# Patient Record
Sex: Male | Born: 1980 | Race: White | Hispanic: No | Marital: Single | Smoking: Current every day smoker
Health system: Southern US, Community
[De-identification: ages and names within clinical notes are randomized; demographics above are authoritative.]

## PROBLEM LIST (undated history)

## (undated) DIAGNOSIS — I10 Essential (primary) hypertension: Secondary | ICD-10-CM

## (undated) DIAGNOSIS — F32A Depression, unspecified: Secondary | ICD-10-CM

## (undated) HISTORY — PX: SHOULDER ARTHROSCOPY: SHX128

---

## 2003-05-14 ENCOUNTER — Encounter: Payer: Self-pay | Admitting: *Deleted

## 2003-05-14 ENCOUNTER — Emergency Department (HOSPITAL_COMMUNITY): Admission: EM | Admit: 2003-05-14 | Discharge: 2003-05-14 | Payer: Self-pay | Admitting: *Deleted

## 2003-06-23 ENCOUNTER — Ambulatory Visit (HOSPITAL_COMMUNITY): Admission: RE | Admit: 2003-06-23 | Discharge: 2003-06-23 | Payer: Self-pay | Admitting: Orthopaedic Surgery

## 2003-06-23 ENCOUNTER — Encounter: Payer: Self-pay | Admitting: Orthopaedic Surgery

## 2003-09-16 ENCOUNTER — Emergency Department (HOSPITAL_COMMUNITY): Admission: EM | Admit: 2003-09-16 | Discharge: 2003-09-16 | Payer: Self-pay | Admitting: Emergency Medicine

## 2003-11-18 ENCOUNTER — Emergency Department (HOSPITAL_COMMUNITY): Admission: EM | Admit: 2003-11-18 | Discharge: 2003-11-18 | Payer: Self-pay | Admitting: Emergency Medicine

## 2003-12-19 ENCOUNTER — Emergency Department (HOSPITAL_COMMUNITY): Admission: EM | Admit: 2003-12-19 | Discharge: 2003-12-19 | Payer: Self-pay | Admitting: Emergency Medicine

## 2004-03-22 ENCOUNTER — Ambulatory Visit (HOSPITAL_COMMUNITY): Admission: RE | Admit: 2004-03-22 | Discharge: 2004-03-22 | Payer: Self-pay | Admitting: Orthopaedic Surgery

## 2004-12-30 ENCOUNTER — Emergency Department (HOSPITAL_COMMUNITY): Admission: EM | Admit: 2004-12-30 | Discharge: 2004-12-30 | Payer: Self-pay | Admitting: Emergency Medicine

## 2005-02-17 ENCOUNTER — Emergency Department (HOSPITAL_COMMUNITY): Admission: EM | Admit: 2005-02-17 | Discharge: 2005-02-17 | Payer: Self-pay | Admitting: Emergency Medicine

## 2005-12-22 ENCOUNTER — Emergency Department (HOSPITAL_COMMUNITY): Admission: EM | Admit: 2005-12-22 | Discharge: 2005-12-23 | Payer: Self-pay | Admitting: Emergency Medicine

## 2006-05-23 ENCOUNTER — Emergency Department (HOSPITAL_COMMUNITY): Admission: EM | Admit: 2006-05-23 | Discharge: 2006-05-23 | Payer: Self-pay | Admitting: Emergency Medicine

## 2006-06-06 ENCOUNTER — Emergency Department (HOSPITAL_COMMUNITY): Admission: EM | Admit: 2006-06-06 | Discharge: 2006-06-06 | Payer: Self-pay | Admitting: Emergency Medicine

## 2006-07-15 ENCOUNTER — Emergency Department (HOSPITAL_COMMUNITY): Admission: EM | Admit: 2006-07-15 | Discharge: 2006-07-15 | Payer: Self-pay | Admitting: Emergency Medicine

## 2007-06-07 ENCOUNTER — Emergency Department (HOSPITAL_COMMUNITY): Admission: EM | Admit: 2007-06-07 | Discharge: 2007-06-07 | Payer: Self-pay | Admitting: Emergency Medicine

## 2007-08-14 ENCOUNTER — Emergency Department (HOSPITAL_COMMUNITY): Admission: EM | Admit: 2007-08-14 | Discharge: 2007-08-14 | Payer: Self-pay | Admitting: Emergency Medicine

## 2007-09-17 ENCOUNTER — Emergency Department (HOSPITAL_COMMUNITY): Admission: EM | Admit: 2007-09-17 | Discharge: 2007-09-17 | Payer: Self-pay | Admitting: Emergency Medicine

## 2008-08-16 ENCOUNTER — Emergency Department (HOSPITAL_COMMUNITY): Admission: EM | Admit: 2008-08-16 | Discharge: 2008-08-16 | Payer: Self-pay | Admitting: Emergency Medicine

## 2008-08-31 ENCOUNTER — Emergency Department (HOSPITAL_COMMUNITY): Admission: EM | Admit: 2008-08-31 | Discharge: 2008-08-31 | Payer: Self-pay | Admitting: Emergency Medicine

## 2008-09-30 ENCOUNTER — Emergency Department (HOSPITAL_COMMUNITY): Admission: EM | Admit: 2008-09-30 | Discharge: 2008-09-30 | Payer: Self-pay | Admitting: Emergency Medicine

## 2008-10-07 ENCOUNTER — Emergency Department (HOSPITAL_COMMUNITY): Admission: EM | Admit: 2008-10-07 | Discharge: 2008-10-07 | Payer: Self-pay | Admitting: Emergency Medicine
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
## 2009-07-02 ENCOUNTER — Emergency Department (HOSPITAL_COMMUNITY): Admission: EM | Admit: 2009-07-02 | Discharge: 2009-07-02 | Payer: Self-pay | Admitting: Emergency Medicine

## 2010-12-31 ENCOUNTER — Emergency Department (HOSPITAL_COMMUNITY): Payer: Non-veteran care

## 2010-12-31 ENCOUNTER — Emergency Department (HOSPITAL_COMMUNITY)
Admission: EM | Admit: 2010-12-31 | Discharge: 2010-12-31 | Disposition: A | Discharge status: Home / Self Care | Payer: Non-veteran care | Attending: Emergency Medicine | Admitting: Emergency Medicine

## 2010-12-31 DIAGNOSIS — S4980XA Other specified injuries of shoulder and upper arm, unspecified arm, initial encounter: Secondary | ICD-10-CM | POA: Insufficient documentation

## 2010-12-31 DIAGNOSIS — S46909A Unspecified injury of unspecified muscle, fascia and tendon at shoulder and upper arm level, unspecified arm, initial encounter: Secondary | ICD-10-CM | POA: Insufficient documentation

## 2010-12-31 DIAGNOSIS — M25519 Pain in unspecified shoulder: Secondary | ICD-10-CM | POA: Insufficient documentation

## 2010-12-31 DIAGNOSIS — X500XXA Overexertion from strenuous movement or load, initial encounter: Secondary | ICD-10-CM | POA: Insufficient documentation

## 2011-01-06 ENCOUNTER — Emergency Department (HOSPITAL_COMMUNITY)
Admission: EM | Admit: 2011-01-06 | Discharge: 2011-01-06 | Disposition: A | Discharge status: Home / Self Care | Payer: Non-veteran care | Attending: Emergency Medicine | Admitting: Emergency Medicine

## 2011-01-06 DIAGNOSIS — F17200 Nicotine dependence, unspecified, uncomplicated: Secondary | ICD-10-CM | POA: Insufficient documentation

## 2011-01-06 DIAGNOSIS — M25519 Pain in unspecified shoulder: Secondary | ICD-10-CM | POA: Insufficient documentation

## 2011-01-13 ENCOUNTER — Emergency Department (HOSPITAL_COMMUNITY)
Admission: EM | Admit: 2011-01-13 | Discharge: 2011-01-13 | Disposition: A | Discharge status: Home / Self Care | Payer: Non-veteran care | Attending: Emergency Medicine | Admitting: Emergency Medicine

## 2011-01-13 ENCOUNTER — Emergency Department (HOSPITAL_COMMUNITY): Payer: Non-veteran care

## 2011-01-13 DIAGNOSIS — R51 Headache: Secondary | ICD-10-CM | POA: Insufficient documentation

## 2011-01-13 DIAGNOSIS — F17200 Nicotine dependence, unspecified, uncomplicated: Secondary | ICD-10-CM | POA: Insufficient documentation

## 2011-01-13 DIAGNOSIS — J111 Influenza due to unidentified influenza virus with other respiratory manifestations: Secondary | ICD-10-CM | POA: Insufficient documentation

## 2011-01-13 LAB — CBC
HCT: 42.2 % (ref 39.0–52.0)
Hemoglobin: 14.5 g/dL (ref 13.0–17.0)
MCH: 32.7 pg (ref 26.0–34.0)
MCHC: 34.4 g/dL (ref 30.0–36.0)
MCV: 95 fL (ref 78.0–100.0)
Platelets: 172 10*3/uL (ref 150–400)
RBC: 4.44 MIL/uL (ref 4.22–5.81)
RDW: 12.8 % (ref 11.5–15.5)
WBC: 6.6 10*3/uL (ref 4.0–10.5)

## 2011-01-13 LAB — DIFFERENTIAL
Basophils Absolute: 0 10*3/uL (ref 0.0–0.1)
Basophils Relative: 0 % (ref 0–1)
Eosinophils Absolute: 0.1 10*3/uL (ref 0.0–0.7)
Eosinophils Relative: 1 % (ref 0–5)
Lymphocytes Relative: 7 % — ABNORMAL LOW (ref 12–46)
Lymphs Abs: 0.5 10*3/uL — ABNORMAL LOW (ref 0.7–4.0)
Monocytes Absolute: 0.6 10*3/uL (ref 0.1–1.0)
Monocytes Relative: 9 % (ref 3–12)
Neutro Abs: 5.5 10*3/uL (ref 1.7–7.7)
Neutrophils Relative %: 84 % — ABNORMAL HIGH (ref 43–77)

## 2011-01-13 LAB — BASIC METABOLIC PANEL
BUN: 13 mg/dL (ref 6–23)
CO2: 25 mEq/L (ref 19–32)
Calcium: 9.6 mg/dL (ref 8.4–10.5)
Chloride: 108 mEq/L (ref 96–112)
Creatinine, Ser: 0.86 mg/dL (ref 0.4–1.5)
GFR calc Af Amer: >60 mL/min (ref >60)
GFR calc non Af Amer: >60 mL/min (ref >60)
Glucose, Bld: 115 mg/dL — ABNORMAL HIGH (ref 70–99)
Potassium: 4 mEq/L (ref 3.5–5.1)
Sodium: 140 mEq/L (ref 135–145)

## 2011-03-23 ENCOUNTER — Emergency Department (HOSPITAL_COMMUNITY): Payer: Non-veteran care

## 2011-03-23 ENCOUNTER — Emergency Department (HOSPITAL_COMMUNITY)
Admission: EM | Admit: 2011-03-23 | Discharge: 2011-03-23 | Disposition: A | Discharge status: Home / Self Care | Payer: Non-veteran care | Attending: Emergency Medicine | Admitting: Emergency Medicine

## 2011-03-23 DIAGNOSIS — IMO0002 Reserved for concepts with insufficient information to code with codable children: Secondary | ICD-10-CM | POA: Insufficient documentation

## 2011-03-23 DIAGNOSIS — F17200 Nicotine dependence, unspecified, uncomplicated: Secondary | ICD-10-CM | POA: Insufficient documentation

## 2011-03-23 DIAGNOSIS — X500XXA Overexertion from strenuous movement or load, initial encounter: Secondary | ICD-10-CM | POA: Insufficient documentation

## 2011-04-08 ENCOUNTER — Emergency Department (HOSPITAL_COMMUNITY): Payer: Non-veteran care

## 2011-04-08 ENCOUNTER — Emergency Department (HOSPITAL_COMMUNITY)
Admission: EM | Admit: 2011-04-08 | Discharge: 2011-04-08 | Disposition: A | Discharge status: Home / Self Care | Payer: Non-veteran care | Attending: Emergency Medicine | Admitting: Emergency Medicine

## 2011-04-08 DIAGNOSIS — M25519 Pain in unspecified shoulder: Secondary | ICD-10-CM | POA: Insufficient documentation

## 2011-04-20 ENCOUNTER — Emergency Department (HOSPITAL_COMMUNITY)
Admission: EM | Admit: 2011-04-20 | Discharge: 2011-04-20 | Disposition: A | Discharge status: Home / Self Care | Payer: Non-veteran care | Attending: Emergency Medicine | Admitting: Emergency Medicine

## 2011-04-20 DIAGNOSIS — F17200 Nicotine dependence, unspecified, uncomplicated: Secondary | ICD-10-CM | POA: Insufficient documentation

## 2011-04-20 DIAGNOSIS — M25519 Pain in unspecified shoulder: Secondary | ICD-10-CM | POA: Insufficient documentation

## 2011-05-02 ENCOUNTER — Emergency Department (HOSPITAL_COMMUNITY)
Admission: EM | Admit: 2011-05-02 | Discharge: 2011-05-02 | Disposition: A | Discharge status: Home / Self Care | Payer: Non-veteran care | Attending: Emergency Medicine | Admitting: Emergency Medicine

## 2011-05-02 DIAGNOSIS — M25519 Pain in unspecified shoulder: Secondary | ICD-10-CM | POA: Insufficient documentation

## 2011-05-02 DIAGNOSIS — M719 Bursopathy, unspecified: Secondary | ICD-10-CM | POA: Insufficient documentation

## 2011-05-02 DIAGNOSIS — M67919 Unspecified disorder of synovium and tendon, unspecified shoulder: Secondary | ICD-10-CM | POA: Insufficient documentation

## 2011-05-02 DIAGNOSIS — F17200 Nicotine dependence, unspecified, uncomplicated: Secondary | ICD-10-CM | POA: Insufficient documentation

## 2011-05-31 ENCOUNTER — Emergency Department (HOSPITAL_COMMUNITY)
Admission: EM | Admit: 2011-05-31 | Discharge: 2011-05-31 | Disposition: A | Discharge status: Home / Self Care | Payer: Non-veteran care | Attending: Emergency Medicine | Admitting: Emergency Medicine

## 2011-05-31 ENCOUNTER — Encounter: Payer: Self-pay | Admitting: *Deleted

## 2011-05-31 ENCOUNTER — Emergency Department (HOSPITAL_COMMUNITY): Payer: Non-veteran care

## 2011-05-31 DIAGNOSIS — S90129A Contusion of unspecified lesser toe(s) without damage to nail, initial encounter: Secondary | ICD-10-CM | POA: Insufficient documentation

## 2011-05-31 DIAGNOSIS — S4980XA Other specified injuries of shoulder and upper arm, unspecified arm, initial encounter: Secondary | ICD-10-CM | POA: Insufficient documentation

## 2011-05-31 DIAGNOSIS — F17200 Nicotine dependence, unspecified, uncomplicated: Secondary | ICD-10-CM | POA: Insufficient documentation

## 2011-05-31 DIAGNOSIS — S46909A Unspecified injury of unspecified muscle, fascia and tendon at shoulder and upper arm level, unspecified arm, initial encounter: Secondary | ICD-10-CM | POA: Insufficient documentation

## 2011-05-31 DIAGNOSIS — X500XXA Overexertion from strenuous movement or load, initial encounter: Secondary | ICD-10-CM | POA: Insufficient documentation

## 2011-05-31 DIAGNOSIS — S90211A Contusion of right great toe with damage to nail, initial encounter: Secondary | ICD-10-CM

## 2011-05-31 DIAGNOSIS — Y92009 Unspecified place in unspecified non-institutional (private) residence as the place of occurrence of the external cause: Secondary | ICD-10-CM | POA: Insufficient documentation

## 2011-05-31 MED ORDER — DICLOFENAC SODIUM 75 MG PO TBEC: 75.0000 mg | DELAYED_RELEASE_TABLET | Freq: Two times a day (BID) | ORAL | Status: DC

## 2011-05-31 MED ORDER — OXYCODONE-ACETAMINOPHEN 5-325 MG PO TABS: 1.0000 | ORAL_TABLET | ORAL | Status: AC | PRN

## 2011-05-31 NOTE — ED Provider Notes (Signed)
History     CSN: [id number] Arrival date & time: 05/31/2011  8:41 PM  Chief Complaint  Patient presents with  . Shoulder Pain   HPI Comments: Patient with hx of chronic left rotator cuff injury states that he was moving a computer desk and the desk fell causing him to "jerk" his left shoulder.  He denies numbness , weakness or fall.  C/o increased pain to the shoulder and also c/o pain to the right great toe.  States the desk fell onto his foot.  He denies other injuries.    Patient is a 30 y.o. male presenting with shoulder injury. The history is provided by the patient.  Shoulder Injury This is a new problem. The current episode started today. The problem occurs constantly. The problem has been unchanged. Associated symptoms include arthralgias and joint swelling. Pertinent negatives include no chest pain, chills, fever, headaches, myalgias, neck pain, numbness or weakness. The symptoms are aggravated by bending and exertion (movement). He has tried nothing for the symptoms. The treatment provided no relief.    History reviewed. No pertinent past medical history.  Past Surgical History  Procedure Date  . Shoulder arthroscopy     No family history on file.  History  Substance Use Topics  . Smoking status: Current Everyday Smoker    Types: Cigarettes  . Smokeless tobacco: Not on file  . Alcohol Use: No      Review of Systems  Constitutional: Negative for fever and chills.  HENT: Negative for neck pain and neck stiffness.   Respiratory: Negative for chest tightness and shortness of breath.   Cardiovascular: Negative.  Negative for chest pain.  Gastrointestinal: Negative.   Musculoskeletal: Positive for joint swelling and arthralgias. Negative for myalgias and back pain.  Neurological: Negative for dizziness, weakness, numbness and headaches.  Hematological: Does not bruise/bleed easily.    Physical Exam  BP 122/77  Pulse 102  Temp(Src) 98.3 F (36.8 C) (Oral)  Resp 20   Ht 6\' 6"  (1.981 m)  Wt 285 lb (129.275 kg)  BMI 32.94 kg/m2  SpO2 99%  Physical Exam  Nursing note and vitals reviewed. Constitutional: He is oriented to person, place, and time. He appears well-developed and well-nourished. No distress.  HENT:  Head: Normocephalic and atraumatic.  Mouth/Throat: Oropharynx is clear and moist.  Neck: Neck supple.  Cardiovascular: Normal rate, regular rhythm and normal heart sounds.   Pulmonary/Chest: Effort normal and breath sounds normal. No respiratory distress. He exhibits no tenderness.  Musculoskeletal: He exhibits edema and tenderness.       Right ankle: Normal. Achilles tendon normal.       Feet:  Lymphadenopathy:    He has no cervical adenopathy.  Neurological: He is alert and oriented to person, place, and time. He exhibits normal muscle tone. Coordination normal.  Skin: Skin is warm and dry.    ED Course  Procedures  MDM   Patient has a subungual hematoma of the right great toenail but he refuses to have it drained.  I have explained to him the risk and likelihood that he will loose the nail and he verbalized understanding of the risks.    2220  I have reviewed the patient's XR results and discussed them with the patient.  He agrees to f/u with the [place].  Has paperwork with him from [place]. Right great toe was buddy taped by the nursing staff  Terrina Docter L. Depaul [place], [place] 05/31/11 2231

## 2011-05-31 NOTE — ED Notes (Signed)
Pt also reports pain to rt great toe

## 2011-05-31 NOTE — ED Notes (Signed)
Pt reports he was moving a Child psychotherapist today and re-injured his left shoulder, pt reports hx of surgery on same

## 2011-06-02 NOTE — ED Provider Notes (Signed)
Medical screening examination/treatment/procedure(s) were performed by non-physician practitioner and as supervising physician I was immediately available for consultation/collaboration.  Joya Gaskins, MD 06/02/11 [phone]

## 2011-08-03 LAB — URINALYSIS, ROUTINE W REFLEX MICROSCOPIC
Bilirubin Urine: NEGATIVE
Glucose, UA: NEGATIVE mg/dL
Glucose, UA: NEGATIVE mg/dL
Hgb urine dipstick: NEGATIVE
Ketones, ur: NEGATIVE mg/dL
Nitrite: NEGATIVE
Nitrite: NEGATIVE
Protein, ur: NEGATIVE mg/dL
Specific Gravity, Urine: 1.02 (ref 1.005–1.030)
Specific Gravity, Urine: >1.03 — ABNORMAL HIGH (ref 1.005–1.030)
Urobilinogen, UA: 0.2 mg/dL (ref 0.0–1.0)
pH: 6 (ref 5.0–8.0)
pH: 6.5 (ref 5.0–8.0)

## 2011-08-07 LAB — URINALYSIS, ROUTINE W REFLEX MICROSCOPIC
Nitrite: NEGATIVE
Specific Gravity, Urine: 1.02
Urobilinogen, UA: 0.2
pH: 7

## 2011-08-08 LAB — RAPID STREP SCREEN (MED CTR MEBANE ONLY): Streptococcus, Group A Screen (Direct): NEGATIVE

## 2011-08-08 LAB — STREP A DNA PROBE

## 2011-12-09 ENCOUNTER — Emergency Department (HOSPITAL_COMMUNITY): Payer: Non-veteran care

## 2011-12-09 ENCOUNTER — Encounter (HOSPITAL_COMMUNITY): Payer: Self-pay

## 2011-12-09 ENCOUNTER — Emergency Department (HOSPITAL_COMMUNITY)
Admission: EM | Admit: 2011-12-09 | Discharge: 2011-12-09 | Disposition: A | Discharge status: Home / Self Care | Payer: Non-veteran care | Attending: Emergency Medicine | Admitting: Emergency Medicine

## 2011-12-09 DIAGNOSIS — Y9361 Activity, american tackle football: Secondary | ICD-10-CM | POA: Insufficient documentation

## 2011-12-09 DIAGNOSIS — W219XXA Striking against or struck by unspecified sports equipment, initial encounter: Secondary | ICD-10-CM | POA: Insufficient documentation

## 2011-12-09 DIAGNOSIS — M25519 Pain in unspecified shoulder: Secondary | ICD-10-CM | POA: Insufficient documentation

## 2011-12-09 DIAGNOSIS — S4990XA Unspecified injury of shoulder and upper arm, unspecified arm, initial encounter: Secondary | ICD-10-CM

## 2011-12-09 DIAGNOSIS — S4980XA Other specified injuries of shoulder and upper arm, unspecified arm, initial encounter: Secondary | ICD-10-CM | POA: Insufficient documentation

## 2011-12-09 DIAGNOSIS — S46909A Unspecified injury of unspecified muscle, fascia and tendon at shoulder and upper arm level, unspecified arm, initial encounter: Secondary | ICD-10-CM | POA: Insufficient documentation

## 2011-12-09 MED ORDER — NAPROXEN 250 MG PO TABS
500.0000 mg | ORAL_TABLET | Freq: Once | ORAL | Status: AC
  Administered 2011-12-09: 500 mg via ORAL
  Filled 2011-12-09: qty 2

## 2011-12-09 MED ORDER — NAPROXEN 500 MG PO TABS: 500.0000 mg | ORAL_TABLET | Freq: Two times a day (BID) | ORAL | Status: DC

## 2011-12-09 MED ORDER — OXYCODONE-ACETAMINOPHEN 5-325 MG PO TABS: 1.0000 | ORAL_TABLET | ORAL | Status: AC | PRN

## 2011-12-09 MED ORDER — OXYCODONE-ACETAMINOPHEN 5-325 MG PO TABS
1.0000 | ORAL_TABLET | Freq: Once | ORAL | Status: AC
  Administered 2011-12-09: 1 via ORAL
  Filled 2011-12-09: qty 1

## 2011-12-09 NOTE — ED Notes (Signed)
Pt states this is related to an older injury. Pt states was playing football with siblings and was injured during the game.

## 2011-12-09 NOTE — ED Notes (Signed)
Pt presents with left shoulder pain after playing football today.

## 2011-12-12 NOTE — ED Provider Notes (Signed)
History     CSN: [id number]  Arrival date & time 12/09/11  1502   First MD Initiated Contact with Patient 12/09/11 1551      Chief Complaint  Patient presents with  . Shoulder Pain    (Consider location/radiation/quality/duration/timing/severity/associated sxs/prior treatment) Patient is a 31 y.o. male presenting with shoulder pain. The history is provided by the patient.  Shoulder Pain This is a recurrent problem. The current episode started today (Brother ran into his outstretched arm as pt was trying to tackle him during a football game). The problem occurs constantly. The problem has been unchanged. Associated symptoms include arthralgias. Pertinent negatives include no abdominal pain, chest pain, congestion, fever, headaches, joint swelling, nausea, neck pain, numbness, rash, sore throat or weakness. Exacerbated by: Worsened by movement and palpation. He has tried nothing for the symptoms.    History reviewed. No pertinent past medical history.  Past Surgical History  Procedure Date  . Shoulder arthroscopy     No family history on file.  History  Substance Use Topics  . Smoking status: Current Everyday Smoker    Types: Cigarettes  . Smokeless tobacco: Not on file  . Alcohol Use: No      Review of Systems  Constitutional: Negative for fever.  HENT: Negative for congestion, sore throat and neck pain.   Eyes: Negative.   Respiratory: Negative for chest tightness and shortness of breath.   Cardiovascular: Negative for chest pain.  Gastrointestinal: Negative for nausea and abdominal pain.  Genitourinary: Negative.   Musculoskeletal: Positive for arthralgias. Negative for joint swelling.  Skin: Negative.  Negative for rash and wound.  Neurological: Negative for dizziness, weakness, light-headedness, numbness and headaches.  Hematological: Negative.   Psychiatric/Behavioral: Negative.     Allergies  Review of patient's allergies indicates no known  allergies.  Home Medications   Current Outpatient Rx  Name Route Sig Dispense Refill  . DICLOFENAC SODIUM 75 MG PO TBEC Oral Take 1 tablet (75 mg total) by mouth 2 (two) times daily. With food 15 tablet 0  . NAPROXEN 500 MG PO TABS Oral Take 1 tablet (500 mg total) by mouth 2 (two) times daily with a meal. 20 tablet 0  . OXYCODONE-ACETAMINOPHEN 5-325 MG PO TABS Oral Take 1 tablet by mouth every 4 (four) hours as needed for pain. 20 tablet 0    BP 128/81  Pulse 105  Temp(Src) 98.3 F (36.8 C) (Oral)  Resp 22  Ht 6\' 6"  (1.981 m)  Wt 327 lb (148.326 kg)  BMI 37.79 kg/m2  SpO2 97%  Physical Exam  Nursing note and vitals reviewed. Constitutional: He is oriented to person, place, and time. He appears well-developed and well-nourished.  HENT:  Head: Normocephalic.  Eyes: Conjunctivae are normal.  Neck: Normal range of motion.  Cardiovascular: Normal rate and intact distal pulses.  Exam reveals no decreased pulses.   Pulses:      Radial pulses are 2+ on the left side.  Pulmonary/Chest: Effort normal.  Musculoskeletal: He exhibits tenderness. He exhibits no edema.       Left shoulder: He exhibits tenderness, bony tenderness and pain. He exhibits no swelling, no effusion, no crepitus, normal pulse and normal strength.  Neurological: He is alert and oriented to person, place, and time. No sensory deficit.  Skin: Skin is warm, dry and intact.    ED Course  Procedures (including critical care time)  Labs Reviewed - No data to display No results found.   1. Shoulder injury  MDM  Ice,  Naprosen,  Oxycodone.  Pt to f[facility]u with [facility] at [facility]        Candis Musa, PA 12/12/11 1401

## 2011-12-14 NOTE — ED Provider Notes (Signed)
Medical screening examination/treatment/procedure(s) were performed by non-physician practitioner and as supervising physician I was immediately available for consultation/collaboration.   Yaneliz Radebaugh L Donne Robillard, MD 12/14/11 1211 

## 2012-01-28 ENCOUNTER — Emergency Department (HOSPITAL_COMMUNITY)
Admission: EM | Admit: 2012-01-28 | Discharge: 2012-01-28 | Discharge status: Against Medical Advice | Payer: Non-veteran care | Attending: Emergency Medicine | Admitting: Emergency Medicine

## 2012-01-28 DIAGNOSIS — R51 Headache: Secondary | ICD-10-CM | POA: Insufficient documentation

## 2012-01-28 DIAGNOSIS — H9209 Otalgia, unspecified ear: Secondary | ICD-10-CM | POA: Insufficient documentation

## 2012-02-09 ENCOUNTER — Emergency Department (HOSPITAL_COMMUNITY)
Admission: EM | Admit: 2012-02-09 | Discharge: 2012-02-09 | Disposition: A | Discharge status: Home / Self Care | Payer: Non-veteran care | Attending: Emergency Medicine | Admitting: Emergency Medicine

## 2012-02-09 ENCOUNTER — Encounter (HOSPITAL_COMMUNITY): Payer: Self-pay | Admitting: *Deleted

## 2012-02-09 ENCOUNTER — Emergency Department (HOSPITAL_COMMUNITY): Payer: Non-veteran care

## 2012-02-09 DIAGNOSIS — X58XXXA Exposure to other specified factors, initial encounter: Secondary | ICD-10-CM | POA: Insufficient documentation

## 2012-02-09 DIAGNOSIS — F17200 Nicotine dependence, unspecified, uncomplicated: Secondary | ICD-10-CM | POA: Insufficient documentation

## 2012-02-09 DIAGNOSIS — S46909A Unspecified injury of unspecified muscle, fascia and tendon at shoulder and upper arm level, unspecified arm, initial encounter: Secondary | ICD-10-CM | POA: Insufficient documentation

## 2012-02-09 DIAGNOSIS — M25519 Pain in unspecified shoulder: Secondary | ICD-10-CM | POA: Insufficient documentation

## 2012-02-09 DIAGNOSIS — S4992XA Unspecified injury of left shoulder and upper arm, initial encounter: Secondary | ICD-10-CM

## 2012-02-09 DIAGNOSIS — S4980XA Other specified injuries of shoulder and upper arm, unspecified arm, initial encounter: Secondary | ICD-10-CM | POA: Insufficient documentation

## 2012-02-09 MED ORDER — IBUPROFEN 800 MG PO TABS
800.0000 mg | ORAL_TABLET | Freq: Once | ORAL | Status: AC
  Administered 2012-02-09: 800 mg via ORAL
  Filled 2012-02-09: qty 1

## 2012-02-09 MED ORDER — IBUPROFEN 800 MG PO TABS: 800.0000 mg | ORAL_TABLET | Freq: Once | ORAL | Status: AC

## 2012-02-09 MED ORDER — OXYCODONE-ACETAMINOPHEN 5-325 MG PO TABS: 1.0000 | ORAL_TABLET | Freq: Once | ORAL | Status: AC

## 2012-02-09 MED ORDER — OXYCODONE-ACETAMINOPHEN 5-325 MG PO TABS
1.0000 | ORAL_TABLET | Freq: Once | ORAL | Status: AC
  Administered 2012-02-09: 1 via ORAL
  Filled 2012-02-09: qty 1

## 2012-02-09 NOTE — ED Notes (Signed)
Pt c/o pain in his left shoulder. Pt states that he grabbed for his lawnmower to keep it from turning over and injured his shoulder.

## 2012-02-09 NOTE — ED Provider Notes (Signed)
History     CSN: [id number]  Arrival date & time 02/09/12  1406   First MD Initiated Contact with Patient 02/09/12 1424      Chief Complaint  Patient presents with  . Shoulder Pain    (Consider location/radiation/quality/duration/timing/severity/associated sxs/prior treatment) HPI Comments: Patient has a known injury in his left shoulder stating that he has a partially torn rotator cuff and has been anticipating the need for surgery but has been waiting for [facility] to [facility] so he can get this addressed.  He was mowing the lawn today on impingement when the lawnmower started to tip and caused his left arm to abduct as he was trying to catch it from tipping over completely.  He has increased severe pain in the anterior shoulder.  Patient is a 31 y.o. [place] presenting with shoulder pain. The history is provided by the patient.  Shoulder Pain This is a recurrent problem. The current episode started today. The problem occurs constantly. The problem has been unchanged. Associated symptoms include arthralgias. Pertinent negatives include no abdominal pain, chest pain, congestion, fever, headaches, joint swelling, nausea, neck pain, numbness, rash, sore throat or weakness.    History reviewed. No pertinent past medical history.  Past Surgical History  Procedure Date  . Shoulder arthroscopy     History reviewed. No pertinent family history.  History  Substance Use Topics  . Smoking status: Current Everyday Smoker    Types: Cigarettes  . Smokeless tobacco: Not on file  . Alcohol Use: No      Review of Systems  Constitutional: Negative for fever.  HENT: Negative for congestion, sore throat and neck pain.   Eyes: Negative.   Respiratory: Negative for chest tightness and shortness of breath.   Cardiovascular: Negative for chest pain.  Gastrointestinal: Negative for nausea and abdominal pain.  Genitourinary: Negative.   Musculoskeletal: Positive for arthralgias. Negative  for joint swelling.  Skin: Negative.  Negative for rash and wound.  Neurological: Negative for dizziness, weakness, light-headedness, numbness and headaches.  Hematological: Negative.   Psychiatric/Behavioral: Negative.     Allergies  Review of patient's allergies indicates no known allergies.  Home Medications   Current Outpatient Rx  Name Route Sig Dispense Refill  . DICLOFENAC SODIUM 75 MG PO TBEC Oral Take 1 tablet (75 mg total) by mouth 2 (two) times daily. With food 15 tablet 0  . IBUPROFEN 800 MG PO TABS Oral Take 1 tablet (800 mg total) by mouth once. 15 tablet 0  . NAPROXEN 500 MG PO TABS Oral Take 1 tablet (500 mg total) by mouth 2 (two) times daily with a meal. 20 tablet 0  . OXYCODONE-ACETAMINOPHEN 5-325 MG PO TABS Oral Take 1 tablet by mouth once. 15 tablet 0    BP 124/90  Pulse 114  Temp(Src) 97.7 F (36.5 C) (Oral)  Resp 24  Ht 6\' 6"  (1.981 m)  Wt 368 lb (166.924 kg)  BMI 42.53 kg/m2  SpO2 98%  Physical Exam  Nursing note and vitals reviewed. Constitutional: He is oriented to person, place, and time. He appears well-developed and well-nourished.  HENT:  Head: Normocephalic.  Eyes: Conjunctivae are normal.  Neck: Normal range of motion.  Cardiovascular: Normal rate and intact distal pulses.  Exam reveals no decreased pulses.   Pulses:      Dorsalis pedis pulses are 2+ on the right side, and 2+ on the left side.       Posterior tibial pulses are 2+ on the right side,  and 2+ on the left side.  Pulmonary/Chest: Effort normal.  Musculoskeletal: He exhibits tenderness.       Left shoulder: He exhibits tenderness. He exhibits no swelling, no effusion, no crepitus, no deformity, no spasm and normal pulse.       Patient is point tender to anterior humeral head without edema or ecchymosis.  He is slight increased pain at the same location with full elbow flexion.  Neurological: He is alert and oriented to person, place, and time. No sensory deficit.  Skin: Skin is  warm, dry and intact.    ED Course  Procedures (including critical care time)  Labs Reviewed - No data to display Dg Shoulder Left  02/09/2012  *RADIOLOGY REPORT*  Clinical Data: Shoulder pain  LEFT SHOULDER - 2+ VIEW  Comparison: None.  Findings: No evidence of fracture dislocation of the left shoulder.  IMPRESSION: No acute osseous abnormality.  Original Report Authenticated By: Genevive Bi, M.D.     1. Injury of left shoulder       MDM  Sling applied to left arm.  Ibuprofen 800 mg 3 times a day, Percocet when necessary.  Patient encouraged to followup with Dr. Hilda Lias for further management of his acute on chronic shoulder injury.  Also encouraged ice therapy.        Candis Musa, PA 02/09/12 1617

## 2012-02-10 NOTE — ED Provider Notes (Signed)
Medical screening examination/treatment/procedure(s) were performed by non-physician practitioner and as supervising physician I was immediately available for consultation/collaboration.   Shelda Jakes, MD 02/10/12 1250

## 2012-03-26 IMAGING — CR DG CHEST 2V
2 series · 2 of 2 positions shown · non-contrast
Comparison: 09/17/2007

CLINICAL DATA: Fever

CHEST - 2 VIEW

[view not recorded (1 of 2)]
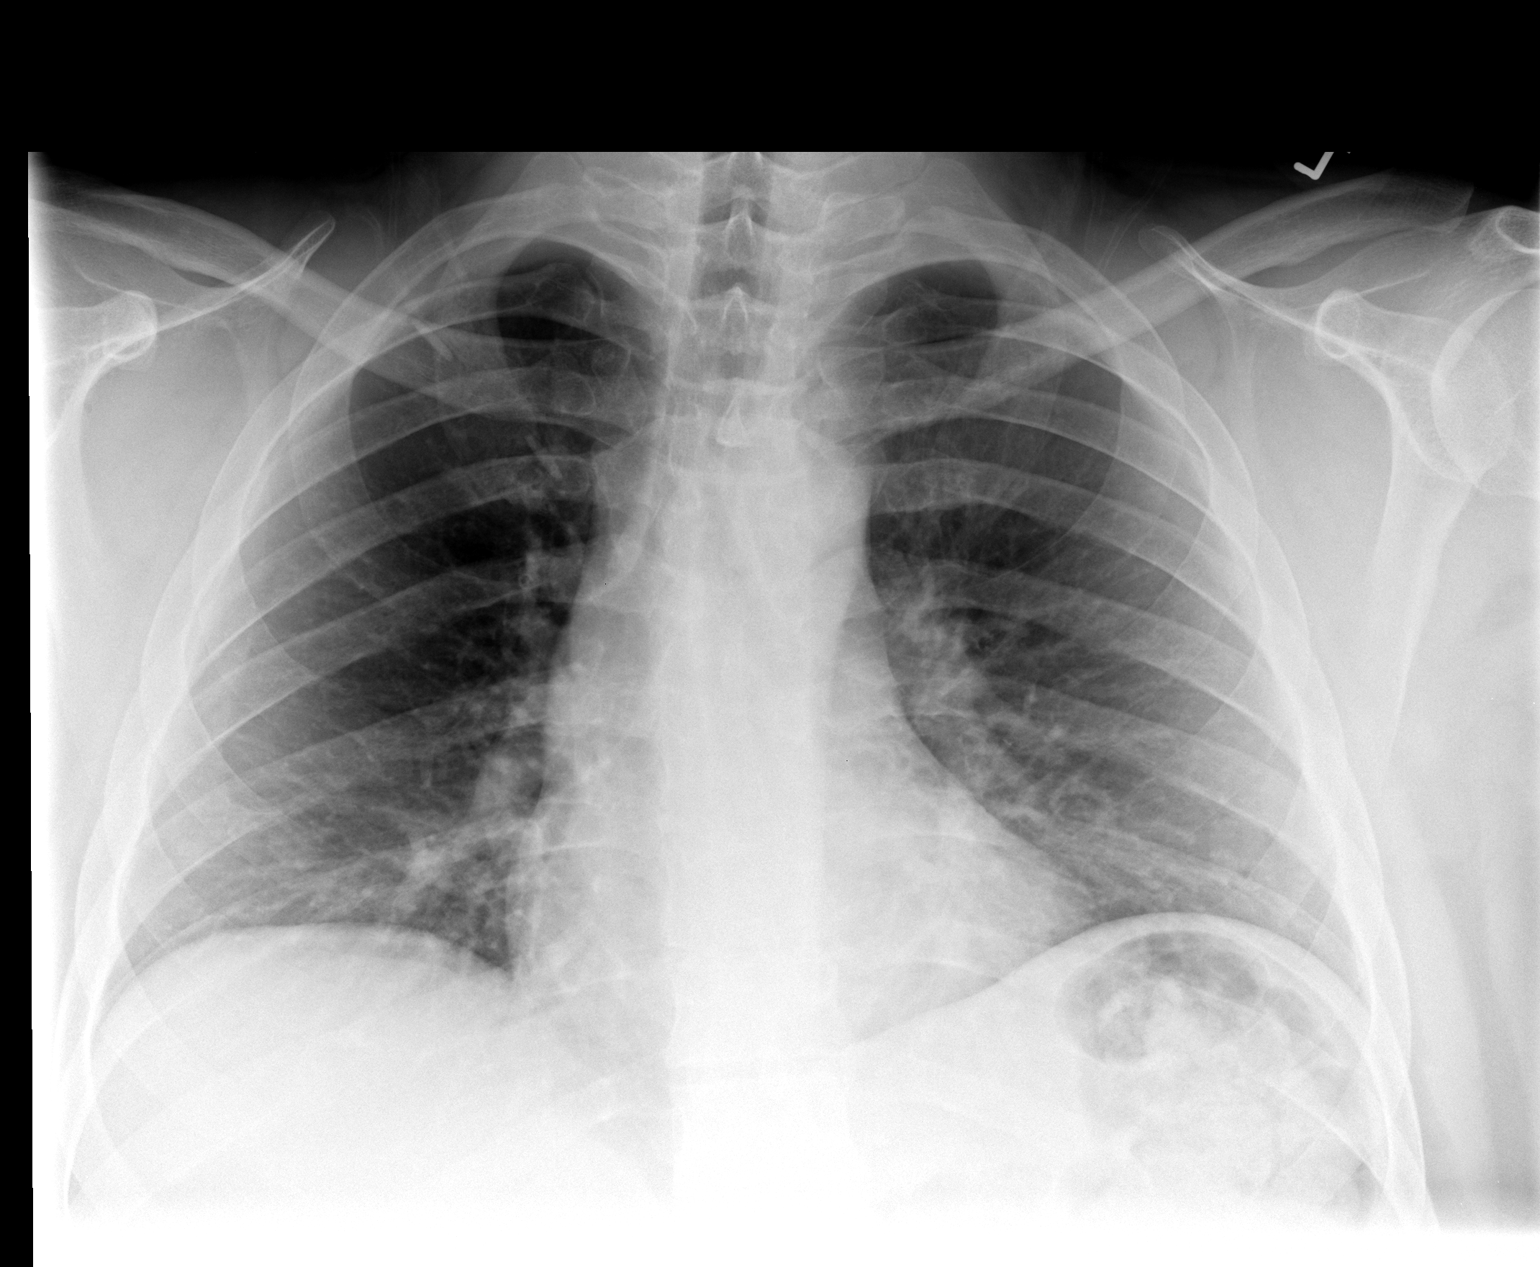

[view not recorded (2 of 2)]
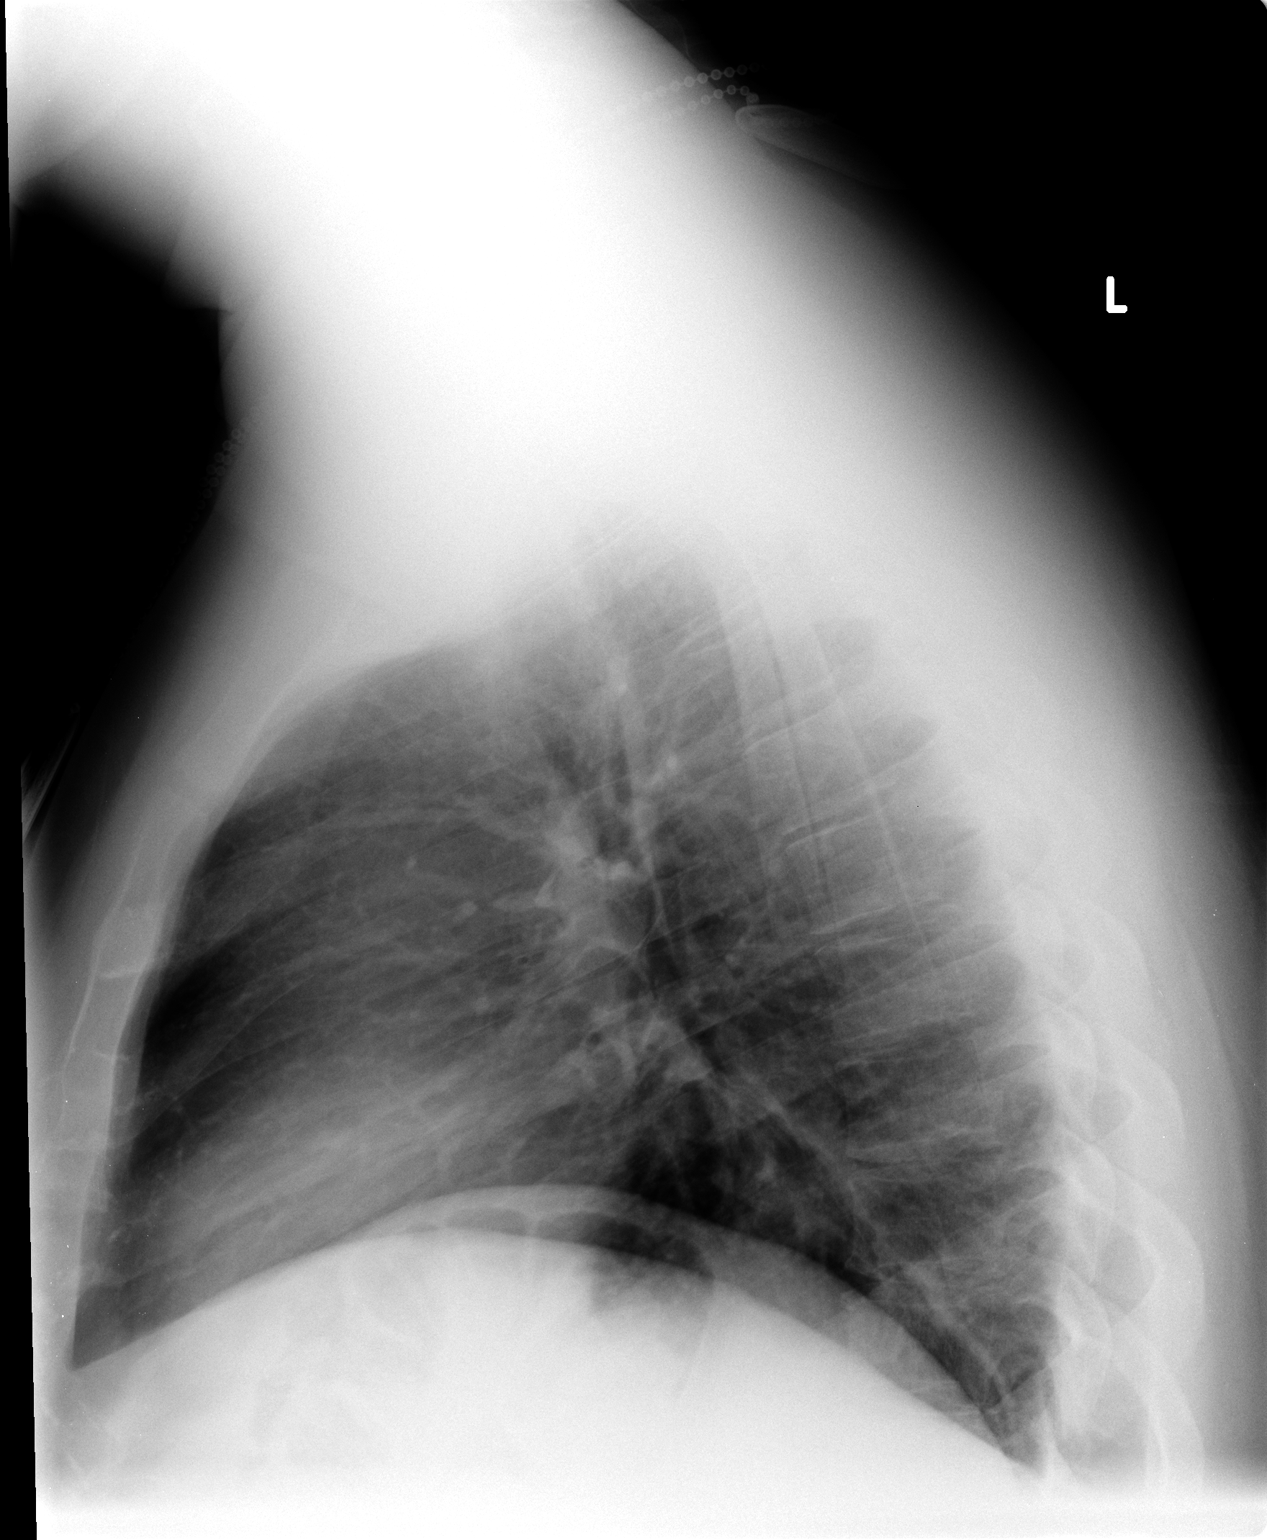

[2 of 2 positions shown; findings below may reference images not displayed]

FINDINGS: Heart is upper normal in size.  Low volumes.  Bibasilar
atelectasis.  No pleural effusion.
IMPRESSION: Bibasilar atelectasis.

## 2012-03-30 ENCOUNTER — Encounter (HOSPITAL_COMMUNITY): Payer: Self-pay | Admitting: *Deleted

## 2012-03-30 ENCOUNTER — Emergency Department (HOSPITAL_COMMUNITY)
Admission: EM | Admit: 2012-03-30 | Discharge: 2012-03-30 | Disposition: A | Discharge status: Home / Self Care | Payer: Non-veteran care | Attending: Emergency Medicine | Admitting: Emergency Medicine

## 2012-03-30 DIAGNOSIS — F17200 Nicotine dependence, unspecified, uncomplicated: Secondary | ICD-10-CM | POA: Insufficient documentation

## 2012-03-30 DIAGNOSIS — H9201 Otalgia, right ear: Secondary | ICD-10-CM

## 2012-03-30 DIAGNOSIS — H6120 Impacted cerumen, unspecified ear: Secondary | ICD-10-CM | POA: Insufficient documentation

## 2012-03-30 MED ORDER — HYDROCODONE-ACETAMINOPHEN 5-325 MG PO TABS
1.0000 | ORAL_TABLET | Freq: Once | ORAL | Status: AC
  Administered 2012-03-30: 1 via ORAL
  Filled 2012-03-30: qty 1

## 2012-03-30 MED ORDER — AMOXICILLIN 500 MG PO CAPS: 500.0000 mg | ORAL_CAPSULE | Freq: Three times a day (TID) | ORAL | Status: AC

## 2012-03-30 MED ORDER — HYDROCODONE-ACETAMINOPHEN 5-325 MG PO TABS: ORAL_TABLET | ORAL | Status: AC

## 2012-03-30 MED ORDER — CARBAMIDE PEROXIDE 6.5 % OT SOLN
10.0000 [drp] | Freq: Once | OTIC | Status: AC
  Administered 2012-03-30: 10 [drp] via OTIC
  Filled 2012-03-30: qty 15

## 2012-03-30 NOTE — Discharge Instructions (Signed)
Cerumen Impaction  A cerumen impaction is when the wax in your ear forms a plug. This plug usually causes reduced hearing. Sometimes it also causes an earache or dizziness. Removing a cerumen impaction can be difficult and painful. The wax sticks to the ear canal. The canal is sensitive and bleeds easily. If you try to remove a heavy wax buildup with a cotton tipped swab, you may push it in further.  Irrigation with water, suction, and small ear curettes may be used to clear out the wax. If the impaction is fixed to the skin in the ear canal, ear drops may be needed for a few days to loosen the wax. People who build up a lot of wax frequently can use ear wax removal products available in your local drugstore.  SEEK MEDICAL CARE IF:    You develop an earache, increased hearing loss, or marked dizziness.  Document Released: 11/22/2004 Document Revised: 10/04/2011 Document Reviewed: 01/12/2010  [employer] Patient Information 2012 [employer]

## 2012-03-30 NOTE — ED Provider Notes (Signed)
History     CSN: [id number]  Arrival date & time 03/30/12  2031   First MD Initiated Contact with Patient 03/30/12 2040      Chief Complaint  Patient presents with  . Otalgia    (Consider location/radiation/quality/duration/timing/severity/associated sxs/prior treatment) HPI Comments: Patient complains of right ear pain and right-sided headache since yesterday. He states he has intermittent difficulties with cerumen buildup in his ear canals. He tried an over-the-counter earwax removal kit yesterday and states the pain to his the right ear worse after flushing his ear. He describes the headache as a sharp stabbing pain to the right side of his head he states the headache pain is intermittent. He also reports intermittent episodes of dizziness. He denies neck pain, fever, vomiting or sore throat.  Patient is a 31 y.o. male presenting with ear pain. The history is provided by the patient.  Otalgia This is a chronic problem. The current episode started more than 1 week ago. There is pain in the right ear. The problem occurs constantly. The problem has been gradually worsening. There has been no fever. The pain is moderate. Associated symptoms include headaches and hearing loss. Pertinent negatives include no ear discharge, no rhinorrhea, no sore throat, no abdominal pain, no diarrhea, no vomiting, no neck pain, no cough and no rash. Past medical history comments: frequent cerumen build up.    History reviewed. No pertinent past medical history.  Past Surgical History  Procedure Date  . Shoulder arthroscopy     History reviewed. No pertinent family history.  History  Substance Use Topics  . Smoking status: Current Everyday Smoker    Types: Cigarettes  . Smokeless tobacco: Not on file  . Alcohol Use: No      Review of Systems  Constitutional: Negative for fever and appetite change.  HENT: Positive for hearing loss and ear pain. Negative for sore throat, facial swelling,  rhinorrhea, neck pain, neck stiffness and ear discharge.   Respiratory: Negative for cough.   Gastrointestinal: Negative for vomiting, abdominal pain and diarrhea.  Skin: Negative for rash.  Neurological: Positive for dizziness and headaches. Negative for weakness and numbness.  All other systems reviewed and are negative.    Allergies  Review of patient's allergies indicates no known allergies.  Home Medications  No current outpatient prescriptions on file.  BP 137/90  Pulse 96  Temp 98.1 F (36.7 C)  Resp 22  Ht 6\' 6"  (1.981 m)  Wt 316 lb (143.337 kg)  BMI 36.52 kg/m2  SpO2 96%  Physical Exam  Nursing note and vitals reviewed. Constitutional: He is oriented to person, place, and time. He appears well-developed and well-nourished. No distress.  HENT:  Head: Normocephalic and atraumatic.  Left Ear: Tympanic membrane normal.       Cerumen impaction to the right ear canal.  TM not visualized.  Ear canal appears erythematous, no drainage. No mastoid tenderness.  Some cerumen also present in the left canal.    Neck: Normal range of motion. Neck supple.  Cardiovascular: Normal rate, regular rhythm and normal heart sounds.   No murmur heard. Pulmonary/Chest: Effort normal and breath sounds normal.  Musculoskeletal: Normal range of motion.  Lymphadenopathy:    He has no cervical adenopathy.  Neurological: He is alert and oriented to person, place, and time. He exhibits normal muscle tone. Coordination normal.  Skin: Skin is dry.    ED Course  Procedures (including critical care time)  Labs Reviewed - No data to display  MDM    Cerumen impaction to the right ear. Debrox was applied. Right ear was irrigated with saline and hydrogen peroxide by the nursing staff with removal of some of the cerumen. Pain is improved. I did advise patient that there is some cerumen still present in the ear canal. I will dispense Debrox for him to use at home and give him an ENT  referral, patient agrees to followup. Vital signs are stable. He is nontoxic appearing.  Ambulates with a steady gait. No focal neuro deficits.    Patient / Family / Caregiver understand and agree with initial ED impression and plan with expectations set for ED visit. Pt stable in ED with no significant deterioration in condition. Pt feels improved after observation and/or treatment in ED.      Nike Southers L. [place], [place] 03/30/12 2307

## 2012-03-30 NOTE — ED Provider Notes (Signed)
Medical screening examination/treatment/procedure(s) were performed by non-physician practitioner and as supervising physician I was immediately available for consultation/collaboration.  Juliet Rude. Rubin Payor, MD 03/30/12 [phone]

## 2012-03-30 NOTE — ED Notes (Signed)
Large amount of ear wax removed from rt ear. Pt continues to c/o pain, however states it feels a little better now.

## 2012-03-30 NOTE — ED Notes (Signed)
Pt c/o right ear pain and headache. Has had problems with his ear off and on for 3 months got worse yesterday.

## 2012-03-30 NOTE — ED Notes (Signed)
Pt C/O rt ear pain x 3 months , however the past couple of days pain has increased and his hearing has decreased. Pt reports purchasing an ear removal kit and using it with little success. Pain has increased tenfold since using the OTC system. Pt denies fever, N/V and URI symptoms.

## 2012-05-14 ENCOUNTER — Emergency Department (HOSPITAL_COMMUNITY)
Admission: EM | Admit: 2012-05-14 | Discharge: 2012-05-14 | Disposition: A | Discharge status: Home / Self Care | Payer: Non-veteran care | Attending: Emergency Medicine | Admitting: Emergency Medicine

## 2012-05-14 ENCOUNTER — Emergency Department (HOSPITAL_COMMUNITY): Payer: Non-veteran care

## 2012-05-14 ENCOUNTER — Encounter (HOSPITAL_COMMUNITY): Payer: Self-pay | Admitting: Emergency Medicine

## 2012-05-14 DIAGNOSIS — Z825 Family history of asthma and other chronic lower respiratory diseases: Secondary | ICD-10-CM | POA: Insufficient documentation

## 2012-05-14 DIAGNOSIS — S46919A Strain of unspecified muscle, fascia and tendon at shoulder and upper arm level, unspecified arm, initial encounter: Secondary | ICD-10-CM

## 2012-05-14 DIAGNOSIS — M25519 Pain in unspecified shoulder: Secondary | ICD-10-CM | POA: Insufficient documentation

## 2012-05-14 DIAGNOSIS — F17200 Nicotine dependence, unspecified, uncomplicated: Secondary | ICD-10-CM | POA: Insufficient documentation

## 2012-05-14 MED ORDER — HYDROCODONE-ACETAMINOPHEN 7.5-500 MG PO TABS: 1.0000 | ORAL_TABLET | ORAL | Status: AC | PRN

## 2012-05-14 MED ORDER — NAPROXEN 500 MG PO TABS: 500.0000 mg | ORAL_TABLET | Freq: Two times a day (BID) | ORAL | Status: DC
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
## 2012-05-19 NOTE — ED Provider Notes (Signed)
Medical screening examination/treatment/procedure(s) were performed by non-physician practitioner and as supervising physician I was immediately available for consultation/collaboration.   Kaleel Schmieder L Carlos Heber, MD 05/19/12 1446 

## 2012-06-16 ENCOUNTER — Emergency Department (HOSPITAL_COMMUNITY)
Admission: EM | Admit: 2012-06-16 | Discharge: 2012-06-16 | Disposition: A | Discharge status: Home / Self Care | Payer: Self-pay | Attending: Emergency Medicine | Admitting: Emergency Medicine

## 2012-06-16 ENCOUNTER — Encounter (HOSPITAL_COMMUNITY): Payer: Self-pay | Admitting: *Deleted

## 2012-06-16 DIAGNOSIS — S01501A Unspecified open wound of lip, initial encounter: Secondary | ICD-10-CM | POA: Insufficient documentation

## 2012-06-16 DIAGNOSIS — S01511A Laceration without foreign body of lip, initial encounter: Secondary | ICD-10-CM

## 2012-06-16 DIAGNOSIS — F17200 Nicotine dependence, unspecified, uncomplicated: Secondary | ICD-10-CM | POA: Insufficient documentation

## 2012-06-16 MED ORDER — LIDOCAINE-EPINEPHRINE (PF) 2 %-1:200000 IJ SOLN
INTRAMUSCULAR | Status: AC
  Filled 2012-06-16: qty 20

## 2012-06-16 MED ORDER — LIDOCAINE-EPINEPHRINE (PF) 2 %-1:200000 IJ SOLN
20.0000 mL | Freq: Once | INTRAMUSCULAR | Status: AC
  Administered 2012-06-16: 20 mL

## 2012-06-16 MED ORDER — PENICILLIN V POTASSIUM 250 MG PO TABS
500.0000 mg | ORAL_TABLET | Freq: Once | ORAL | Status: AC
  Administered 2012-06-16: 500 mg via ORAL
  Filled 2012-06-16: qty 2

## 2012-06-16 MED ORDER — LIDOCAINE-EPINEPHRINE 2 %-1:100000 IJ SOLN
20.0000 mL | Freq: Once | INTRAMUSCULAR | Status: DC
  Filled 2012-06-16: qty 20

## 2012-06-16 MED ORDER — PENICILLIN V POTASSIUM 500 MG PO TABS: 500.0000 mg | ORAL_TABLET | Freq: Three times a day (TID) | ORAL | Status: AC

## 2012-06-16 MED ORDER — TRAMADOL-ACETAMINOPHEN 37.5-325 MG PO TABS: ORAL_TABLET | ORAL | Status: AC
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
## 2012-06-16 NOTE — ED Notes (Signed)
LACERATION REPAIR Performed by: Burgess Amor Authorized by: Burgess Amor Consent: Verbal consent obtained. Risks and benefits: risks, benefits and alternatives were discussed Consent given by: patient Patient identity confirmed: provided demographic data Prepped and Draped in normal sterile fashion Wound explored  Laceration Location: left upper lip  Laceration Length: 1.25 cm linear lac,  2 cm flap lac  No Foreign Bodies seen or palpated  Anesthesia: local infiltration  Local anesthetic: lidocaine 2% with epinephrine  Anesthetic total: 1 ml  Irrigation method: syringe Amount of cleaning: standard  Skin closure:vicryl plus 5-0  Number of sutures: 6 - 3 in the linear lac and 3 in the flap lac  Technique: simple interrupted  Patient tolerance: Patient tolerated the procedure well with no immediate complications.   Burgess Amor, PA 06/16/12 1610  [name], MD 11/16/14 [phone]

## 2012-08-20 ENCOUNTER — Emergency Department (HOSPITAL_COMMUNITY)
Admission: EM | Admit: 2012-08-20 | Discharge: 2012-08-20 | Disposition: A | Discharge status: Home / Self Care | Payer: Self-pay | Attending: Emergency Medicine | Admitting: Emergency Medicine

## 2012-08-20 ENCOUNTER — Encounter (HOSPITAL_COMMUNITY): Payer: Self-pay | Admitting: *Deleted

## 2012-08-20 DIAGNOSIS — K089 Disorder of teeth and supporting structures, unspecified: Secondary | ICD-10-CM | POA: Insufficient documentation

## 2012-08-20 DIAGNOSIS — F17200 Nicotine dependence, unspecified, uncomplicated: Secondary | ICD-10-CM | POA: Insufficient documentation

## 2012-08-20 DIAGNOSIS — K0889 Other specified disorders of teeth and supporting structures: Secondary | ICD-10-CM

## 2012-08-20 MED ORDER — AMOXICILLIN 500 MG PO CAPS: 500.0000 mg | ORAL_CAPSULE | Freq: Three times a day (TID) | ORAL | Status: DC

## 2012-08-20 MED ORDER — HYDROCODONE-ACETAMINOPHEN 5-325 MG PO TABS: ORAL_TABLET | ORAL | Status: DC

## 2012-08-20 NOTE — ED Notes (Signed)
Upper,rt molar, dental pain for months, worse since last pm

## 2012-08-20 NOTE — ED Notes (Signed)
Pt presents with chronic dental pain. Pt states has no dental insurance at this time. No swelling noted.  NAD noted.

## 2012-08-20 NOTE — ED Provider Notes (Signed)
History     CSN: [id number]  Arrival date & time 08/20/12  1651   First MD Initiated Contact with Patient 08/20/12 1708      Chief Complaint  Patient presents with  . Dental Pain    (Consider location/radiation/quality/duration/timing/severity/associated sxs/prior treatment) Patient is a 31 y.o. male presenting with tooth pain. The history is provided by the patient.  Dental PainThe primary symptoms include mouth pain. Primary symptoms do not include dental injury, oral bleeding, headaches, fever, shortness of breath, sore throat, angioedema or cough. The symptoms began yesterday. The symptoms are worsening. The symptoms are new. The symptoms occur constantly.  Mouth pain is worsening. Affected locations include: gum(s) and teeth.  Additional symptoms include: dental sensitivity to temperature, gum tenderness and purulent gums. Additional symptoms do not include: gum swelling, trismus, facial swelling, trouble swallowing, taste disturbance, drooling and ear pain. Medical issues include: smoking and periodontal disease.    History reviewed. No pertinent past medical history.  Past Surgical History  Procedure Date  . Shoulder arthroscopy     Family History  Problem Relation Age of Onset  . Asthma Brother     History  Substance Use Topics  . Smoking status: Current Every Day Smoker -- 1.0 packs/day for 15 years    Types: Cigarettes  . Smokeless tobacco: Never Used  . Alcohol Use: No      Review of Systems  Constitutional: Negative for fever and appetite change.  HENT: Positive for dental problem. Negative for ear pain, congestion, sore throat, facial swelling, drooling, trouble swallowing, neck pain and neck stiffness.   Eyes: Negative for pain and visual disturbance.  Respiratory: Negative for cough and shortness of breath.   Neurological: Negative for dizziness, facial asymmetry and headaches.  Hematological: Negative for adenopathy.  All other systems reviewed and  are negative.    Allergies  Review of patient's allergies indicates no known allergies.  Home Medications   Current Outpatient Rx  Name Route Sig Dispense Refill  . IBUPROFEN 200 MG PO TABS Oral Take 600 mg by mouth every 6 (six) hours as needed. For shoulder pain    . NAPROXEN 500 MG PO TABS Oral Take 1 tablet (500 mg total) by mouth 2 (two) times daily. 30 tablet 0    BP 136/85  Pulse 106  Temp 97.8 F (36.6 C) (Oral)  Resp 20  Ht 6\' 6"  (1.981 m)  Wt 320 lb (145.151 kg)  BMI 36.98 kg/m2  SpO2 100%  Physical Exam  Nursing note and vitals reviewed. Constitutional: He is oriented to person, place, and time. He appears well-developed and well-nourished. No distress.  HENT:  Head: Normocephalic and atraumatic. No trismus in the jaw.  Right Ear: Tympanic membrane and ear canal normal.  Left Ear: Tympanic membrane and ear canal normal.  Mouth/Throat: Uvula is midline, oropharynx is clear and moist and mucous membranes are normal. Dental caries present. No dental abscesses or uvula swelling.         Dental decay and ttp of the right upper third molar and surrounding gums.  No facial edema, trismus, or obvious dental abscess.   Neck: Normal range of motion. Neck supple.  Cardiovascular: Normal rate, regular rhythm and normal heart sounds.   No murmur heard. Pulmonary/Chest: Effort normal and breath sounds normal.  Musculoskeletal: Normal range of motion.  Lymphadenopathy:    He has no cervical adenopathy.  Neurological: He is alert and oriented to person, place, and time. He exhibits normal muscle tone. Coordination normal.  Skin: Skin is warm and dry.    ED Course  Procedures (including critical care time)  Labs Reviewed - No data to display No results found.      MDM    Vitals reviewed, pt is non-toxic appearing.    Patient agrees to dental f/u.  Dentist referral list given.    Prescribed: Amoxil norco #20    Matraca Hunkins L. Heavan Francom, [place] 08/20/12 1739

## 2012-08-21 NOTE — ED Provider Notes (Signed)
Medical screening examination/treatment/procedure(s) were performed by non-physician practitioner and as supervising physician I was immediately available for consultation/collaboration. Ziv Welchel, MD, FACEP   Diana Armijo L Sumeya Yontz, MD 08/21/12 0057 

## 2012-11-18 ENCOUNTER — Emergency Department (HOSPITAL_COMMUNITY)
Admission: EM | Admit: 2012-11-18 | Discharge: 2012-11-18 | Disposition: A | Discharge status: Home / Self Care | Payer: Self-pay | Attending: Emergency Medicine | Admitting: Emergency Medicine

## 2012-11-18 ENCOUNTER — Encounter (HOSPITAL_COMMUNITY): Payer: Self-pay | Admitting: *Deleted

## 2012-11-18 DIAGNOSIS — F17200 Nicotine dependence, unspecified, uncomplicated: Secondary | ICD-10-CM | POA: Insufficient documentation

## 2012-11-18 DIAGNOSIS — M26609 Unspecified temporomandibular joint disorder, unspecified side: Secondary | ICD-10-CM | POA: Insufficient documentation

## 2012-11-18 DIAGNOSIS — H9209 Otalgia, unspecified ear: Secondary | ICD-10-CM | POA: Insufficient documentation

## 2012-11-18 MED ORDER — HYDROCODONE-ACETAMINOPHEN 5-325 MG PO TABS: ORAL_TABLET | ORAL | Status: DC

## 2012-11-18 MED ORDER — NAPROXEN 500 MG PO TABS: 500.0000 mg | ORAL_TABLET | Freq: Two times a day (BID) | ORAL | Status: DC

## 2012-11-18 NOTE — ED Provider Notes (Signed)
History     CSN: [id number]  Arrival date & time 11/18/12  1155   First MD Initiated Contact with Patient 11/18/12 1343      Chief Complaint  Patient presents with  . Jaw Pain    (Consider location/radiation/quality/duration/timing/severity/associated sxs/prior treatment) HPI Comments: Patient c/o bilateral jaw pain and clicking sensation to his jaws with chewing.  Also c/o lower gum tenderness right > left.  Denies toothache, facial swelling, trismus or neck pain.    Patient is a 32 y.o. male presenting with tooth pain. The history is provided by the patient.  Dental PainThe primary symptoms include mouth pain. Primary symptoms do not include headaches, fever, shortness of breath, sore throat, angioedema or cough. The symptoms began more than 1 week ago. The symptoms are unchanged. The symptoms are recurrent. The symptoms occur constantly.  Mouth pain occurs constantly. Mouth pain is unchanged.  Additional symptoms include: gum tenderness, jaw pain and ear pain. Additional symptoms do not include: dental sensitivity to temperature, gum swelling, trismus, facial swelling, trouble swallowing, taste disturbance, smell disturbance and drooling. Medical issues do not include: smoking.    History reviewed. No pertinent past medical history.  Past Surgical History  Procedure Date  . Shoulder arthroscopy     Family History  Problem Relation Age of Onset  . Asthma Brother     History  Substance Use Topics  . Smoking status: Current Every Day Smoker -- 1.0 packs/day for 15 years    Types: Cigarettes  . Smokeless tobacco: Never Used  . Alcohol Use: No      Review of Systems  Constitutional: Negative for fever, activity change and appetite change.  HENT: Positive for ear pain and dental problem. Negative for congestion, sore throat, facial swelling, drooling, mouth sores, trouble swallowing, neck pain and neck stiffness.   Eyes: Negative for pain and visual disturbance.    Respiratory: Negative for cough and shortness of breath.   Cardiovascular: Negative for chest pain and palpitations.  Gastrointestinal: Negative for nausea and vomiting.  Skin: Negative for color change and rash.  Neurological: Negative for dizziness, facial asymmetry and headaches.  Hematological: Negative for adenopathy.  All other systems reviewed and are negative.    Allergies  Review of patient's allergies indicates no known allergies.  Home Medications   Current Outpatient Rx  Name  Route  Sig  Dispense  Refill  . IBUPROFEN 200 MG PO TABS   Oral   Take 600 mg by mouth every 6 (six) hours as needed. For jaw pain           BP 130/83  Pulse 94  Temp 98.3 F (36.8 C) (Oral)  Resp 20  SpO2 97%  Physical Exam  Nursing note and vitals reviewed. Constitutional: He is oriented to person, place, and time. He appears well-developed and well-nourished. No distress.  HENT:  Head: Normocephalic and atraumatic. No trismus in the jaw.  Right Ear: Tympanic membrane and ear canal normal.  Left Ear: Tympanic membrane and ear canal normal.  Mouth/Throat: Uvula is midline, oropharynx is clear and moist and mucous membranes are normal. No dental abscesses, uvula swelling or dental caries.       Crepitus of the bilateral TM joints.  Dentition appears nml, no malocclusions of the jaw.  Neck: Normal range of motion. Neck supple.  Cardiovascular: Normal rate, regular rhythm, normal heart sounds and intact distal pulses.   No murmur heard. Pulmonary/Chest: Effort normal and breath sounds normal.  Musculoskeletal: Normal range of motion.  Lymphadenopathy:    He has no cervical adenopathy.  Neurological: He is alert and oriented to person, place, and time. He exhibits normal muscle tone. Coordination normal.  Skin: Skin is warm and dry.    ED Course  Procedures (including critical care time)  Labs Reviewed - No data to display No results found.      MDM    Crepitus of  bilateral TMJ joints, right > left.  No trismus or deformity of the mandible. dentition appears nml.     Pt agrees to f/u with a dentist soon    Carle Dargan L. [place], [place] 11/20/12 1623

## 2012-11-18 NOTE — ED Notes (Signed)
Pt c/o bilateral jaw pain worse on right side since 2012, became worse over the past few weeks, pt also c/o ears being "clogged" up

## 2012-11-20 NOTE — ED Provider Notes (Signed)
Medical screening examination/treatment/procedure(s) were performed by non-physician practitioner and as supervising physician I was immediately available for consultation/collaboration.   Benny Lennert, MD 11/20/12 [phone]

## 2012-12-18 ENCOUNTER — Encounter (HOSPITAL_COMMUNITY): Payer: Self-pay | Admitting: Emergency Medicine

## 2012-12-18 ENCOUNTER — Emergency Department (HOSPITAL_COMMUNITY)
Admission: EM | Admit: 2012-12-18 | Discharge: 2012-12-18 | Disposition: A | Discharge status: Home / Self Care | Payer: Self-pay | Attending: Emergency Medicine | Admitting: Emergency Medicine

## 2012-12-18 DIAGNOSIS — F17200 Nicotine dependence, unspecified, uncomplicated: Secondary | ICD-10-CM | POA: Insufficient documentation

## 2012-12-18 DIAGNOSIS — K029 Dental caries, unspecified: Secondary | ICD-10-CM | POA: Insufficient documentation

## 2012-12-18 DIAGNOSIS — H9209 Otalgia, unspecified ear: Secondary | ICD-10-CM | POA: Insufficient documentation

## 2012-12-18 DIAGNOSIS — K089 Disorder of teeth and supporting structures, unspecified: Secondary | ICD-10-CM | POA: Insufficient documentation

## 2012-12-18 MED ORDER — AMOXICILLIN 250 MG PO CAPS
500.0000 mg | ORAL_CAPSULE | Freq: Once | ORAL | Status: AC
  Administered 2012-12-18: 500 mg via ORAL
  Filled 2012-12-18: qty 2

## 2012-12-18 MED ORDER — OXYCODONE-ACETAMINOPHEN 5-325 MG PO TABS
1.0000 | ORAL_TABLET | Freq: Once | ORAL | Status: AC
  Administered 2012-12-18: 1 via ORAL
  Filled 2012-12-18: qty 1

## 2012-12-18 MED ORDER — AMOXICILLIN 500 MG PO CAPS: 500.0000 mg | ORAL_CAPSULE | Freq: Three times a day (TID) | ORAL | Status: DC

## 2012-12-18 MED ORDER — HYDROCODONE-ACETAMINOPHEN 5-325 MG PO TABS: ORAL_TABLET | ORAL | Status: DC

## 2012-12-18 NOTE — ED Notes (Signed)
Pt c/o intermittant pain to r upper molar. States it is a hole there. Has appt with dentist soon. This episode x 2 days. No obvious swelling noted. Pt pacing due to pain. Grimacing. Pa  Aware after triage.

## 2012-12-18 NOTE — ED Provider Notes (Signed)
Medical screening examination/treatment/procedure(s) were performed by non-physician practitioner and as supervising physician I was immediately available for consultation/collaboration.  Raeford Razor, MD 12/18/12 [phone]

## 2012-12-18 NOTE — ED Provider Notes (Signed)
History     CSN: [id number]  Arrival date & time 12/18/12  4098   First MD Initiated Contact with Patient 12/18/12 1010      Chief Complaint  Patient presents with  . Dental Pain    (Consider location/radiation/quality/duration/timing/severity/associated sxs/prior treatment) Patient is a 32 y.o. male presenting with tooth pain. The history is provided by the patient.  Dental PainThe primary symptoms include mouth pain. Primary symptoms do not include dental injury, oral bleeding, headaches, fever, shortness of breath, sore throat, angioedema or cough. The symptoms began 2 days ago. The symptoms are worsening. The symptoms are new. The symptoms occur constantly.  Mouth pain occurs constantly. Mouth pain is worsening. Affected locations include: teeth.  Additional symptoms include: dental sensitivity to temperature and ear pain. Additional symptoms do not include: gum swelling, gum tenderness, purulent gums, trismus, jaw pain, facial swelling, trouble swallowing, pain with swallowing and swollen glands. Medical issues include: smoking and periodontal disease.    History reviewed. No pertinent past medical history.  Past Surgical History  Procedure Laterality Date  . Shoulder arthroscopy      Family History  Problem Relation Age of Onset  . Asthma Brother     History  Substance Use Topics  . Smoking status: Current Every Day Smoker -- 1.00 packs/day for 15 years    Types: Cigarettes  . Smokeless tobacco: Never Used  . Alcohol Use: No      Review of Systems  Constitutional: Negative for fever and appetite change.  HENT: Positive for ear pain and dental problem. Negative for congestion, sore throat, facial swelling, trouble swallowing, neck pain and neck stiffness.   Eyes: Negative for pain and visual disturbance.  Respiratory: Negative for cough and shortness of breath.   Neurological: Negative for dizziness, facial asymmetry and headaches.  Hematological: Negative for  adenopathy.  All other systems reviewed and are negative.    Allergies  Review of patient's allergies indicates no known allergies.  Home Medications   Current Outpatient Rx  Name  Route  Sig  Dispense  Refill  . HYDROcodone-acetaminophen (NORCO/VICODIN) 5-325 MG per tablet      Take one-two tabs po q 4-6 hrs prn pain   10 tablet   0   . ibuprofen (ADVIL,MOTRIN) 200 MG tablet   Oral   Take 600 mg by mouth every 6 (six) hours as needed. For jaw pain         . naproxen (NAPROSYN) 500 MG tablet   Oral   Take 1 tablet (500 mg total) by mouth 2 (two) times daily.   30 tablet   0     BP 113/84  Pulse 96  Temp(Src) 97.7 F (36.5 C) (Oral)  Resp 22  SpO2 96%  Physical Exam  Nursing note and vitals reviewed. Constitutional: He is oriented to person, place, and time. He appears well-developed and well-nourished.  Patient appears uncomfortable  HENT:  Head: Normocephalic and atraumatic. No trismus in the jaw.  Right Ear: Tympanic membrane and ear canal normal.  Left Ear: Tympanic membrane and ear canal normal.  Mouth/Throat: Uvula is midline, oropharynx is clear and moist and mucous membranes are normal. Dental caries present. No dental abscesses or edematous.    Partial avulsion of the tooth secondary to dental decay w/o obvious dental abscess  Neck: Normal range of motion. Neck supple.  Cardiovascular: Normal rate, regular rhythm and normal heart sounds.   No murmur heard. Pulmonary/Chest: Effort normal and breath sounds normal.  Musculoskeletal: Normal range  of motion.  Lymphadenopathy:    He has no cervical adenopathy.  Neurological: He is alert and oriented to person, place, and time. He exhibits normal muscle tone. Coordination normal.  Skin: Skin is warm and dry.    ED Course  Procedures (including critical care time)  Labs Reviewed - No data to display No results found.      MDM   Previous ED charts reviewed.  ttp of the right upper molar.   Dental decay present with likely exposure of the nerve.  No swelling of the gums or face.  No trismus.  No sublingual abnml.  Pt states he [facility] app[facility]   Prescribe  Amoxil norco # 15  Thorne Wirz L. H[place], [place] 12/18/12 1041

## 2021-02-16 ENCOUNTER — Encounter (HOSPITAL_COMMUNITY): Payer: Self-pay | Admitting: *Deleted

## 2021-02-16 ENCOUNTER — Emergency Department (HOSPITAL_COMMUNITY)
Admission: EM | Admit: 2021-02-16 | Discharge: 2021-02-16 | Disposition: A | Discharge status: Home / Self Care | Payer: No Typology Code available for payment source | Attending: Emergency Medicine | Admitting: Emergency Medicine

## 2021-02-16 ENCOUNTER — Other Ambulatory Visit: Payer: Self-pay

## 2021-02-16 DIAGNOSIS — X500XXA Overexertion from strenuous movement or load, initial encounter: Secondary | ICD-10-CM | POA: Diagnosis not present

## 2021-02-16 DIAGNOSIS — S39012A Strain of muscle, fascia and tendon of lower back, initial encounter: Secondary | ICD-10-CM | POA: Insufficient documentation

## 2021-02-16 DIAGNOSIS — F17210 Nicotine dependence, cigarettes, uncomplicated: Secondary | ICD-10-CM | POA: Insufficient documentation

## 2021-02-16 DIAGNOSIS — S3992XA Unspecified injury of lower back, initial encounter: Secondary | ICD-10-CM | POA: Diagnosis present

## 2021-02-16 MED ORDER — HYDROMORPHONE HCL 1 MG/ML IJ SOLN
0.5000 mg | Freq: Once | INTRAMUSCULAR | Status: AC
  Administered 2021-02-16: 0.5 mg via INTRAVENOUS
  Filled 2021-02-16: qty 1

## 2021-02-16 MED ORDER — KETOROLAC TROMETHAMINE 30 MG/ML IJ SOLN
30.0000 mg | Freq: Once | INTRAMUSCULAR | Status: AC
  Administered 2021-02-16: 30 mg via INTRAVENOUS
  Filled 2021-02-16: qty 1

## 2021-02-16 MED ORDER — MORPHINE SULFATE (PF) 4 MG/ML IV SOLN
4.0000 mg | Freq: Once | INTRAVENOUS | Status: AC
  Administered 2021-02-16: 4 mg via INTRAVENOUS
  Filled 2021-02-16: qty 1

## 2021-02-16 MED ORDER — NAPROXEN 500 MG PO TABS: 500.0000 mg | ORAL_TABLET | Freq: Two times a day (BID) | ORAL | 0 refills | Status: DC

## 2021-02-16 MED ORDER — METHOCARBAMOL 500 MG PO TABS: 1000.0000 mg | ORAL_TABLET | Freq: Two times a day (BID) | ORAL | 0 refills | Status: DC | PRN

## 2021-02-16 MED ORDER — METHOCARBAMOL 1000 MG/10ML IJ SOLN
1000.0000 mg | Freq: Once | INTRAVENOUS | Status: AC
  Administered 2021-02-16: 1000 mg via INTRAVENOUS
  Filled 2021-02-16: qty 10

## 2021-02-16 NOTE — ED Notes (Signed)
Pharmacy was called regarding robaxin as it is not kept in ED, pharmacy to deliver

## 2021-02-16 NOTE — Discharge Instructions (Addendum)
Take naproxen 2 times a day with meals.  Do not take other anti-inflammatories at the same time (Advil, Motrin, ibuprofen, Aleve). You may supplement with Tylenol if you need further pain control. Use Robaxin as needed for muscle stiffness or soreness. Have caution, as this may make you tired or groggy. Do not drive or operate heavy machinery while taking this medication.  Use muscle creams (bengay, icy hot, salonpas) as needed for pain.  Follow up with your primary care doctor if pain is not improving with this treatment.  You may also follow-up with the neurosurgery office listed below Return to the ER if you develop high fevers, numbness, loss of bowel or bladder control, or any new or concerning symptoms.

## 2021-02-16 NOTE — ED Provider Notes (Signed)
[facility] EMERGENCY DEPARTMENT Provider Note   CSN: [id number] Arrival date & time: 02/16/21  1241     History Chief Complaint  Patient presents with  . Back Pain    Seth Wheeler is a 40 y.o. male presented for evaluation of back pain.  Patient states yesterday he did some heavy lifting.  He did not have any pain at the time, but once he sat down, he developed mid back pain.  Pain has been consistent since, described as a stabbing pain.  It does not radiate.  He reports history of back pain in the past, but nothing as severe as this.  He denies fall, trauma, or injury.  He denies fevers chills nausea vomiting abdominal pain, urinary symptoms, loss of bowel bladder control, numbness, tingling.  No history of cancer or IVDU.  He took ibuprofen and Flexeril without improvement of symptoms, has not tried anything else.  HPI     History reviewed. No pertinent past medical history.  There are no problems to display for this patient.   Past Surgical History:  Procedure Laterality Date  . SHOULDER ARTHROSCOPY         Family History  Problem Relation Age of Onset  . Asthma Brother     Social History   Tobacco Use  . Smoking status: Current Every Day Smoker    Packs/day: 1.00    Years: 15.00    Pack years: 15.00    Types: Cigarettes  . Smokeless tobacco: Never Used  Substance Use Topics  . Alcohol use: No  . Drug use: No    Home Medications Prior to Admission medications   Medication Sig Start Date End Date Taking? Authorizing Provider  cyclobenzaprine (FLEXERIL) 10 MG tablet Take 10 mg by mouth 3 (three) times daily as needed for muscle spasms.   Yes [provider]  ibuprofen (ADVIL,MOTRIN) 200 MG tablet Take 600 mg by mouth every 6 (six) hours as needed. For jaw pain   Yes [provider]  methocarbamol (ROBAXIN) 500 MG tablet Take 2 tablets (1,000 mg total) by mouth 2 (two) times daily as needed for muscle spasms. 02/16/21  Yes Mang Hazelrigg,  Tahji Willits, PA-C  naproxen (NAPROSYN) 500 MG tablet Take 1 tablet (500 mg total) by mouth 2 (two) times daily with a meal. 02/16/21  Yes Ellias Mcelreath, PA-C  amoxicillin (AMOXIL) 500 MG capsule Take 1 capsule (500 mg total) by mouth 3 (three) times daily. Patient not taking: Reported on 02/16/2021 12/18/12   Pauline Aus, PA-C  HYDROcodone-acetaminophen (NORCO/VICODIN) 5-325 MG per tablet Take one-two tabs po q 4-6 hrs prn pain Patient not taking: Reported on 02/16/2021 11/18/12   Pauline Aus, PA-C  HYDROcodone-acetaminophen (NORCO/VICODIN) 5-325 MG per tablet Take one-two tabs po q 4-6 hrs prn pain Patient not taking: Reported on 02/16/2021 12/18/12   Pauline Aus, PA-C    Allergies    Patient has no known allergies.  Review of Systems   Review of Systems  Musculoskeletal: Positive for back pain.  All other systems reviewed and are negative.   Physical Exam Updated Vital Signs BP (!) 132/119   Pulse 91   Temp 98.6 F (37 C) (Oral)   Resp 16   Ht 6\' 7"  (2.007 m)   Wt (!) 145.2 kg   SpO2 100%   BMI 36.05 kg/m   Physical Exam Vitals and nursing note reviewed.  Constitutional:      General: He is not in acute distress.    Appearance: He is well-developed.  He is obese.     Comments: Appears uncomfortable, but nontoxic  HENT:     Head: Normocephalic and atraumatic.  Eyes:     Conjunctiva/sclera: Conjunctivae normal.     Pupils: Pupils are equal, round, and reactive to light.  Cardiovascular:     Rate and Rhythm: Normal rate and regular rhythm.     Pulses: Normal pulses.  Pulmonary:     Effort: Pulmonary effort is normal. No respiratory distress.     Breath sounds: Normal breath sounds. No wheezing.  Abdominal:     General: Bowel sounds are normal. There is no distension.     Palpations: Abdomen is soft.     Tenderness: There is no abdominal tenderness.  Musculoskeletal:        General: Tenderness present. Normal range of motion.     Cervical back: Normal range of  motion and neck supple.     Comments: Diffuse tenderness palpation of the entire mid and lower back bilaterally and over midline spine.  No focal pain.  No step-offs or deformities.  No pain over the buttock.  No saddle anesthesia.  Good distal sensation.  Skin:    General: Skin is warm and dry.     Capillary Refill: Capillary refill takes less than 2 seconds.  Neurological:     Mental Status: He is alert and oriented to person, place, and time.     ED Results / Procedures / Treatments   Labs (all labs ordered are listed, but only abnormal results are displayed) Labs Reviewed - No data to display  EKG None  Radiology No results found.  Procedures Procedures   Medications Ordered in ED Medications  ketorolac (TORADOL) 30 MG/ML injection 30 mg (30 mg Intravenous Given 02/16/21 1419)  methocarbamol (ROBAXIN) 1,000 mg in dextrose 5 % 100 mL IVPB (0 mg Intravenous Stopped 02/16/21 1725)  morphine 4 MG/ML injection 4 mg (4 mg Intravenous Given 02/16/21 1418)  HYDROmorphone (DILAUDID) injection 0.5 mg (0.5 mg Intravenous Given 02/16/21 1732)    ED Course  I have reviewed the triage vital signs and the nursing notes.  Pertinent labs & imaging results that were available during my care of the patient were reviewed by me and considered in my medical decision making (see chart for details).    MDM Rules/Calculators/A&P                          Patient presented for evaluation of back pain.  On exam, patient is nontoxic.  He has diffuse tenderness palpation of the musculature of the back.  Pain did not begin when lifting, but soon afterwards.  Is described as a cramping sharp pain.  As such, low suspicion for acute bony abnormality.  No red flags of back pain.  Doubt cauda equina syndrome, vertebral infection, myelopathy.  Will treat symptomatically and reassess.  On reassessment, patient reports significant improvement of symptoms.  I discussed continued symptomatic treatment.  Follow-up  with PCP/neurosurgery, resources given.  At this time, patient appears safe for discharge.  Return precautions given.  Patient states he understands and agrees to plan.  Final Clinical Impression(s) / ED Diagnoses Final diagnoses:  Back strain, initial encounter    Rx / DC Orders ED Discharge Orders         Ordered    methocarbamol (ROBAXIN) 500 MG tablet  2 times daily PRN        02/16/21 1705    naproxen (NAPROSYN) 500 MG  tablet  2 times daily with meals        02/16/21 1705           Almond Fitzgibbon, PA-C 02/16/21 1759    Mancel Bale, MD 02/17/21 0930

## 2021-02-16 NOTE — ED Triage Notes (Signed)
Pt is here for lower back pain since moving something yesterday.  No urinary incontinence or weakness with this.  Pain 8/10.  Pt ambulatory to room

## 2022-01-23 ENCOUNTER — Ambulatory Visit: Payer: No Typology Code available for payment source | Admitting: Internal Medicine

## 2022-02-09 ENCOUNTER — Encounter (HOSPITAL_COMMUNITY): Payer: Self-pay | Admitting: *Deleted

## 2022-02-09 ENCOUNTER — Emergency Department (HOSPITAL_COMMUNITY)
Admission: EM | Admit: 2022-02-09 | Discharge: 2022-02-09 | Disposition: A | Discharge status: Home / Self Care | Payer: No Typology Code available for payment source | Attending: Emergency Medicine | Admitting: Emergency Medicine

## 2022-02-09 DIAGNOSIS — R2232 Localized swelling, mass and lump, left upper limb: Secondary | ICD-10-CM | POA: Diagnosis present

## 2022-02-09 NOTE — Discharge Instructions (Signed)
Likely the mass in your arm is a lipoma, I would like you to follow-up with your [facility],  community health and wellness or general surgery the contact information above. ? ?Come back to the emergency department if you develop chest pain, shortness of breath, severe abdominal pain, uncontrolled nausea, vomiting, diarrhea. ? ?

## 2022-02-09 NOTE — ED Triage Notes (Signed)
Also concerned he is having a reaction to medication he take, states he does not feel right ?

## 2022-02-09 NOTE — ED Provider Notes (Signed)
?[facility] EMERGENCY DEPARTMENT ?Provider Note ? ? ?CSN: [id number] ?Arrival date & time: 02/09/22  1107 ? ?  ? ?History ? ?No chief complaint on file. ? ? ?Seth Wheeler is a 41 y.o. male. ? ?HPI ? ?Patient with medical history including polysubstance dependency presents with complaint of a mass on his left underarm.  He states he has had this mass for about a years time, states she went to his primary care doctor they states it is a lipoma.  He states that he wants it reevaluated as this becomes slightly larger and he is unable to see his primary doctor for a while.  He denies any drainage or discharge from the area, denies any overlying skin changes, no recent trauma to the area, he is not immunocompromise currently.   He also notes that he feels generalized fatigue, states he is feeling fatigued for last 6 months, thinks it is possibly from working 2 jobs as well as the  [employer].   He has no associated fevers chills chest pain shortness of breath general body aches no stomach pains nausea vomit diarrhea states he will tolerate p.o. having normal bowel movements.  He denies any recent changes medications, no tongue throat or lip swelling difficulty breathing or overlying skin changes. ? ?Review patient's chart he is managed by the [place]. ? ? ? ?Home Medications ?Prior to Admission medications   ?Medication Sig Start Date End Date Taking? Authorizing Provider  ?Buprenorphine HCl (SUBUTEX SL) Place 28 mg under the tongue daily.   Yes [provider]  ?amoxicillin (AMOXIL) 500 MG capsule Take 1 capsule (500 mg total) by mouth 3 (three) times daily. ?Patient not taking: Reported on 02/16/2021 12/18/12   Pauline Aus, PA-C  ?HYDROcodone-acetaminophen (NORCO/VICODIN) 5-325 MG per tablet Take one-two tabs po q 4-6 hrs prn pain ?Patient not taking: Reported on 02/16/2021 11/18/12   Pauline Aus, PA-C  ?HYDROcodone-acetaminophen (NORCO/VICODIN) 5-325 MG per tablet Take one-two tabs po q 4-6 hrs prn  pain ?Patient not taking: Reported on 02/16/2021 12/18/12   Pauline Aus, PA-C  ?methocarbamol (ROBAXIN) 500 MG tablet Take 2 tablets (1,000 mg total) by mouth 2 (two) times daily as needed for muscle spasms. ?Patient not taking: Reported on 02/09/2022 02/16/21   Caccavale, Sophia, PA-C  ?naproxen (NAPROSYN) 500 MG tablet Take 1 tablet (500 mg total) by mouth 2 (two) times daily with a meal. ?Patient not taking: Reported on 02/09/2022 02/16/21   Caccavale, Sophia, PA-C  ?   ? ?Allergies    ?Patient has no known allergies.   ? ?Review of Systems   ?Review of Systems  ?Constitutional:  Positive for fatigue. Negative for chills and fever.  ?Respiratory:  Negative for shortness of breath.   ?Cardiovascular:  Negative for chest pain.  ?Gastrointestinal:  Negative for abdominal pain.  ?Neurological:  Negative for headaches.  ? ?Physical Exam ?Updated Vital Signs ?BP 128/89   Pulse 70   Temp 97.8 ?F (36.6 ?C) (Oral)   Resp 20   Ht 6\' 7"  (2.007 m)   Wt 117.5 kg   SpO2 97%   BMI 29.18 kg/m?  ?Physical Exam ?Vitals and nursing note reviewed.  ?Constitutional:   ?   General: He is not in acute distress. ?   Appearance: He is not ill-appearing.  ?HENT:  ?   Head: Normocephalic and atraumatic.  ?   Nose: No congestion.  ?Eyes:  ?   Conjunctiva/sclera: Conjunctivae normal.  ?Cardiovascular:  ?   Rate and Rhythm: Normal rate  and regular rhythm.  ?   Pulses: Normal pulses.  ?   Heart sounds: No murmur heard. ?  No friction rub. No gallop.  ?Pulmonary:  ?   Effort: No respiratory distress.  ?   Breath sounds: No wheezing, rhonchi or rales.  ?Abdominal:  ?   Palpations: Abdomen is soft.  ?   Tenderness: There is no abdominal tenderness. There is no right CVA tenderness or left CVA tenderness.  ?Musculoskeletal:  ?   Comments: Patient is a none fixated mass on his left axilla region, it is round smooth not spiculated nontender my exam no fluctuant induration present no overlying skin changes  ?Skin: ?   General: Skin is warm and  dry.  ?Neurological:  ?   Mental Status: He is alert.  ?Psychiatric:     ?   Mood and Affect: Mood normal.  ? ? ?ED Results / Procedures / Treatments   ?Labs ?(all labs ordered are listed, but only abnormal results are displayed) ?Labs Reviewed - No data to display ? ?EKG ?None ? ?Radiology ?No results found. ? ?Procedures ?Procedures  ? ? ?Medications Ordered in ED ?Medications - No data to display ? ?ED Course/ Medical Decision Making/ A&P ?  ?                        ?Medical Decision Making ? ?This patient presents to the ED for concern of left arm mass, this involves an extensive number of treatment options, and is a complaint that carries with it a high risk of complications and morbidity.  The differential diagnosis includes abscess, malignancy, lipoma ? ? ? ?Additional history obtained: ? ?Additional history obtained from N/A ?External records from outside source obtained and reviewed including [facility] records ? ? ?Co morbidities that complicate the patient evaluation ? ?N/A ? ?Social Determinants of Health: ? ?Currently lost his primary care provider-will refer him to kidney health and wellness ? ? ? ?Lab Tests: ? ?I Ordered, and personally interpreted labs.  The pertinent results include: N/A ? ? ?Imaging Studies ordered: ? ?I ordered imaging studies including N/A ?I independently visualized and interpreted imaging which showed N/A ?I agree with the radiologist interpretation ? ? ?Cardiac Monitoring: ? ?The patient was maintained on a cardiac monitor.  I personally viewed and interpreted the cardiac monitored which showed an underlying rhythm of: N/A ? ? ?Medicines ordered and prescription drug management: ? ?I ordered medication including N/A ?I have reviewed the patients home medicines and have made adjustments as needed ? ?Critical Interventions: ? ?N/A ? ? ?Reevaluation: ? ?N/A ? ?Consultations Obtained: ? ?N/A ? ? ? ?Test Considered: ? ?Ultrasound but will defer as my suspicion for abscess is very low at  this time, present more consistent with a lipoma. ? ? ? ?Rule out ?Low suspicion for cellulitis is no overlying skin changes, no evidence of infection present.  Low suspicion for abscess as there is no fluctuance or induration noted, present more consistent with a lipoma. ? ? ? ?Dispostion and problem list ? ?After consideration of the diagnostic results and the patients response to treatment, I feel that the patent would benefit from discharge. ? ?Arm mass-likely a lipoma, will refer him to general surgery as well as community health and wellness for reevaluation and strict return precautions. ? ? ? ? ? ? ? ? ? ? ? ?Final Clinical Impression(s) / ED Diagnoses ?Final diagnoses:  ?Arm mass, left  ? ? ?Rx / DC  Orders ?ED Discharge Orders   ? ? None  ? ?  ? ? ?  ?Marcello Fennel, PA-C ?02/09/22 1350 ? ?  ?Fredia Sorrow, MD ?02/12/22 2337 ? ?

## 2022-02-09 NOTE — ED Triage Notes (Signed)
Mass under left arm for a year ?

## 2022-05-05 ENCOUNTER — Other Ambulatory Visit: Payer: Self-pay

## 2022-05-05 ENCOUNTER — Emergency Department (HOSPITAL_COMMUNITY)
Admission: EM | Admit: 2022-05-05 | Discharge: 2022-05-05 | Disposition: A | Discharge status: Home / Self Care | Payer: No Typology Code available for payment source | Attending: Emergency Medicine | Admitting: Emergency Medicine

## 2022-05-05 ENCOUNTER — Encounter (HOSPITAL_COMMUNITY): Payer: Self-pay

## 2022-05-05 DIAGNOSIS — L559 Sunburn, unspecified: Secondary | ICD-10-CM

## 2022-05-05 DIAGNOSIS — L551 Sunburn of second degree: Secondary | ICD-10-CM | POA: Diagnosis present

## 2022-05-05 MED ORDER — OXYCODONE-ACETAMINOPHEN 5-325 MG PO TABS: 1.0000 | ORAL_TABLET | Freq: Four times a day (QID) | ORAL | 0 refills | Status: AC | PRN

## 2022-05-05 MED ORDER — KETOROLAC TROMETHAMINE 60 MG/2ML IM SOLN
60.0000 mg | Freq: Once | INTRAMUSCULAR | Status: AC
  Administered 2022-05-05: 60 mg via INTRAMUSCULAR
  Filled 2022-05-05: qty 2

## 2022-05-05 NOTE — ED Triage Notes (Signed)
Pt drove to ED with c/o of pain from sunburn 4 days ago, pt has sunburn to back, torso, chest and lower legs. Pt says he has not put any moisturizer or sunburn relief ointment on skin b/c he "was scared of infection"

## 2022-05-05 NOTE — ED Provider Notes (Signed)
[facility] EMERGENCY DEPARTMENT Provider Note   CSN: [id number] Arrival date & time: 05/05/22  0301     History  Chief Complaint  Patient presents with   Sunburn    Seth Wheeler is a 41 y.o. male.  Patient presents to the emergency department because of severe pain secondary to sunburn.  Patient reports that he was out in the sun for 6 hours 4 days ago and suffered a sunburn to his legs, chest, abdomen and back.  He has been using ibuprofen but is still experiencing a great deal of pain.       Home Medications Prior to Admission medications   Medication Sig Start Date End Date Taking? Authorizing Provider  oxyCODONE-acetaminophen (PERCOCET/ROXICET) 5-325 MG tablet Take 1 tablet by mouth every 6 (six) hours as needed for severe pain. 05/05/22  Yes Marcos Ruelas, Canary Brim, MD      Allergies    Patient has no known allergies.    Review of Systems   Review of Systems  Physical Exam Updated Vital Signs BP 121/83   Pulse 75   Temp 97.7 F (36.5 C) (Oral)   Resp 18   Ht 6\' 7"  (2.007 m)   Wt 122.5 kg   SpO2 100%   BMI 30.42 kg/m  Physical Exam Vitals and nursing note reviewed.  Constitutional:      General: He is not in acute distress.    Appearance: He is well-developed.  HENT:     Head: Normocephalic and atraumatic.     Mouth/Throat:     Mouth: Mucous membranes are moist.  Eyes:     General: Vision grossly intact. Gaze aligned appropriately.     Extraocular Movements: Extraocular movements intact.     Conjunctiva/sclera: Conjunctivae normal.  Cardiovascular:     Rate and Rhythm: Normal rate and regular rhythm.     Pulses: Normal pulses.     Heart sounds: Normal heart sounds, S1 normal and S2 normal. No murmur heard.    No friction rub. No gallop.  Pulmonary:     Effort: Pulmonary effort is normal. No respiratory distress.     Breath sounds: Normal breath sounds.  Abdominal:     Palpations: Abdomen is soft.     Tenderness: There is no abdominal  tenderness. There is no guarding or rebound.     Hernia: No hernia is present.  Musculoskeletal:        General: No swelling.     Cervical back: Full passive range of motion without pain, normal range of motion and neck supple. No pain with movement, spinous process tenderness or muscular tenderness. Normal range of motion.     Right lower leg: No edema.     Left lower leg: No edema.  Skin:    General: Skin is warm and dry.     Capillary Refill: Capillary refill takes less than 2 seconds.     Findings: No ecchymosis, erythema, lesion or wound.     Comments: Diffuse erythema of lower extremities, chest, abdomen and upper back.  Scattered small blisters present.  Neurological:     Mental Status: He is alert and oriented to person, place, and time.     GCS: GCS eye subscore is 4. GCS verbal subscore is 5. GCS motor subscore is 6.     Cranial Nerves: Cranial nerves 2-12 are intact.     Sensory: Sensation is intact.     Motor: Motor function is intact. No weakness or abnormal muscle tone.  Coordination: Coordination is intact.  Psychiatric:        Mood and Affect: Mood normal.        Speech: Speech normal.        Behavior: Behavior normal.     ED Results / Procedures / Treatments   Labs (all labs ordered are listed, but only abnormal results are displayed) Labs Reviewed - No data to display  EKG None  Radiology No results found.  Procedures Procedures    Medications Ordered in ED Medications  ketorolac (TORADOL) injection 60 mg (has no administration in time range)    ED Course/ Medical Decision Making/ A&P                           Medical Decision Making Risk Prescription drug management.   Patient presents to the emergency department for evaluation of moderate to severe sunburn.  No significant blisters.  No signs of sun poisoning.  No signs of superinfection.        Final Clinical Impression(s) / ED Diagnoses Final diagnoses:  Sunburn    Rx / DC  Orders ED Discharge Orders          Ordered    oxyCODONE-acetaminophen (PERCOCET/ROXICET) 5-325 MG tablet  Every 6 hours PRN        05/05/22 0420              Gilda Crease, MD 05/05/22 [phone]

## 2022-05-06 ENCOUNTER — Encounter (HOSPITAL_COMMUNITY): Payer: Self-pay | Admitting: Emergency Medicine

## 2022-05-06 ENCOUNTER — Other Ambulatory Visit: Payer: Self-pay

## 2022-05-06 ENCOUNTER — Emergency Department (HOSPITAL_COMMUNITY)
Admission: EM | Admit: 2022-05-06 | Discharge: 2022-05-06 | Disposition: A | Discharge status: Home / Self Care | Payer: No Typology Code available for payment source | Attending: Emergency Medicine | Admitting: Emergency Medicine

## 2022-05-06 DIAGNOSIS — L559 Sunburn, unspecified: Secondary | ICD-10-CM | POA: Diagnosis present

## 2022-05-06 LAB — COMPREHENSIVE METABOLIC PANEL WITH GFR
ALT: 15 U/L (ref 0–44)
AST: 14 U/L — ABNORMAL LOW (ref 15–41)
Albumin: 3.3 g/dL — ABNORMAL LOW (ref 3.5–5.0)
Alkaline Phosphatase: 38 U/L (ref 38–126)
Anion gap: 4 — ABNORMAL LOW (ref 5–15)
BUN: 17 mg/dL (ref 6–20)
CO2: 28 mmol/L (ref 22–32)
Calcium: 8.2 mg/dL — ABNORMAL LOW (ref 8.9–10.3)
Chloride: 108 mmol/L (ref 98–111)
Creatinine, Ser: 0.71 mg/dL (ref 0.61–1.24)
GFR, Estimated: >60 mL/min
Glucose, Bld: 105 mg/dL — ABNORMAL HIGH (ref 70–99)
Potassium: 4 mmol/L (ref 3.5–5.1)
Sodium: 140 mmol/L (ref 135–145)
Total Bilirubin: 0.5 mg/dL (ref 0.3–1.2)
Total Protein: 5.7 g/dL — ABNORMAL LOW (ref 6.5–8.1)

## 2022-05-06 LAB — CBC WITH DIFFERENTIAL/PLATELET
Abs Immature Granulocytes: 0.02 10*3/uL (ref 0.00–0.07)
Basophils Absolute: 0 10*3/uL (ref 0.0–0.1)
Basophils Relative: 1 %
Eosinophils Absolute: 0.1 10*3/uL (ref 0.0–0.5)
Eosinophils Relative: 3 %
HCT: 37.1 % — ABNORMAL LOW (ref 39.0–52.0)
Hemoglobin: 11.6 g/dL — ABNORMAL LOW (ref 13.0–17.0)
Immature Granulocytes: 0 %
Lymphocytes Relative: 22 %
Lymphs Abs: 1 10*3/uL (ref 0.7–4.0)
MCH: 30.1 pg (ref 26.0–34.0)
MCHC: 31.3 g/dL (ref 30.0–36.0)
MCV: 96.1 fL (ref 80.0–100.0)
Monocytes Absolute: 0.3 10*3/uL (ref 0.1–1.0)
Monocytes Relative: 7 %
Neutro Abs: 3 10*3/uL (ref 1.7–7.7)
Neutrophils Relative %: 67 %
Platelets: 235 10*3/uL (ref 150–400)
RBC: 3.86 MIL/uL — ABNORMAL LOW (ref 4.22–5.81)
RDW: 13 % (ref 11.5–15.5)
WBC: 4.5 10*3/uL (ref 4.0–10.5)
nRBC: 0 % (ref 0.0–0.2)

## 2022-05-06 LAB — MAGNESIUM: Magnesium: 1.9 mg/dL (ref 1.7–2.4)

## 2022-05-06 MED ORDER — CALAMINE EX LOTN
1.0000 | TOPICAL_LOTION | Freq: Once | CUTANEOUS | Status: AC
  Administered 2022-05-06: 1 via TOPICAL
  Filled 2022-05-06: qty 177

## 2022-05-06 MED ORDER — LACTATED RINGERS IV BOLUS
2000.0000 mL | Freq: Once | INTRAVENOUS | Status: AC
Start: 2022-05-06 — End: 2022-05-06
  Administered 2022-05-06: 2000 mL via INTRAVENOUS

## 2022-05-06 MED ORDER — KETOROLAC TROMETHAMINE 15 MG/ML IJ SOLN
15.0000 mg | Freq: Once | INTRAMUSCULAR | Status: AC
Start: 2022-05-06 — End: 2022-05-06
  Administered 2022-05-06: 15 mg via INTRAVENOUS
  Filled 2022-05-06: qty 1

## 2022-05-06 MED ORDER — CALAMINE EX LOTN: 1.0000 | TOPICAL_LOTION | CUTANEOUS | 0 refills | Status: AC | PRN

## 2022-05-06 NOTE — ED Triage Notes (Signed)
Patient seen here yesterday for sunburn to chest, abd, and, legs. Per patient sunburn x1 week with blistering to chest. Per patient given pain medication yesterday while here in ED but no improvement and pain is progressively getting worse. Patient using Aloe with no relief.

## 2022-05-06 NOTE — ED Provider Notes (Signed)
[facility] EMERGENCY DEPARTMENT Provider Note   CSN: [id number] Arrival date & time: 05/06/22  4401     History  Chief Complaint  Patient presents with   Sunburn    Seth Wheeler is a 41 y.o. male.  HPI Patient presents for sunburn.  Medical history includes polysubstance abuse.  He was seen in the ED yesterday morning for sunburn that he has suffered 4 days before.  He was given Toradol and prescribed Percocet.  Today he reports continued pain.  He took 1 to 1.5 tablets of Percocet every 4 hours since he was last seen.  He has ran out.  His pain is so severe that he has not been able to sleep.  He took ibuprofen at 3 AM.  He has not taken anything since then.  He denies any other current concerns.    Home Medications Prior to Admission medications   Medication Sig Start Date End Date Taking? Authorizing Provider  calamine lotion Apply 1 Application topically as needed for itching. 05/06/22  Yes Gloris Manchester, MD  oxyCODONE-acetaminophen (PERCOCET/ROXICET) 5-325 MG tablet Take 1 tablet by mouth every 6 (six) hours as needed for severe pain. 05/05/22   Gilda Crease, MD      Allergies    Patient has no known allergies.    Review of Systems   Review of Systems  Skin:  Positive for color change.  All other systems reviewed and are negative.   Physical Exam Updated Vital Signs BP 120/82 (BP Location: Left Arm)   Pulse 89   Temp 97.6 F (36.4 C) (Oral)   Resp 20   Ht 6\' 7"  (2.007 m)   Wt 120.2 kg   SpO2 100%   BMI 29.85 kg/m  Physical Exam Vitals and nursing note reviewed.  Constitutional:      General: He is not in acute distress.    Appearance: Normal appearance. He is well-developed. He is not ill-appearing, toxic-appearing or diaphoretic.  HENT:     Head: Normocephalic and atraumatic.     Right Ear: External ear normal.     Left Ear: External ear normal.     Nose: Nose normal.     Mouth/Throat:     Mouth: Mucous membranes are moist.     Pharynx:  Oropharynx is clear.  Eyes:     Extraocular Movements: Extraocular movements intact.     Conjunctiva/sclera: Conjunctivae normal.  Cardiovascular:     Rate and Rhythm: Normal rate and regular rhythm.     Heart sounds: No murmur heard. Pulmonary:     Effort: Pulmonary effort is normal. No respiratory distress.  Abdominal:     General: There is no distension.     Palpations: Abdomen is soft.     Tenderness: There is no abdominal tenderness.  Musculoskeletal:        General: No swelling. Normal range of motion.     Cervical back: Normal range of motion and neck supple.  Skin:    General: Skin is warm and dry.     Findings: Erythema present.     Comments: Blanchable areas of sunburn to chest, abdomen, back, and anterior lower extremities below the knees  Neurological:     General: No focal deficit present.     Mental Status: He is alert and oriented to person, place, and time.     Cranial Nerves: No cranial nerve deficit.     Sensory: No sensory deficit.     Motor: No weakness.  Coordination: Coordination normal.  Psychiatric:        Mood and Affect: Mood normal.          ED Results / Procedures / Treatments   Labs (all labs ordered are listed, but only abnormal results are displayed) Labs Reviewed  CBC WITH DIFFERENTIAL/PLATELET - Abnormal; Notable for the following components:      Result Value   RBC 3.86 (*)    Hemoglobin 11.6 (*)    HCT 37.1 (*)    All other components within normal limits  COMPREHENSIVE METABOLIC PANEL - Abnormal; Notable for the following components:   Glucose, Bld 105 (*)    Calcium 8.2 (*)    Total Protein 5.7 (*)    Albumin 3.3 (*)    AST 14 (*)    Anion gap 4 (*)    All other components within normal limits  MAGNESIUM    EKG None  Radiology No results found.  Procedures Procedures    Medications Ordered in ED Medications  lactated ringers bolus 2,000 mL (2,000 mLs Intravenous New Bag/Given 05/06/22 0755)  ketorolac  (TORADOL) 15 MG/ML injection 15 mg (15 mg Intravenous Given 05/06/22 0755)  calamine lotion 1 Application (1 Application Topical Given 05/06/22 0934)    ED Course/ Medical Decision Making/ A&P                           Medical Decision Making Amount and/or Complexity of Data Reviewed Labs: ordered.  Risk OTC drugs. Prescription drug management.   Patient presents for persistent pain secondary to a sunburn that he sustained 5 days ago.  He states that he was walking outside for 6 hours and developed a sunburn at that time.  He was seen in the ED yesterday morning.  He was given 6 tablets of Percocet, all of which he has taken.  He presents for persistent pain.  Vital signs are normal on arrival.  On exam, patient does have diffuse blanchable erythema in pattern consistent with sunburn.  There is some mild desquamation in central area of chest.  There are no intact blisters.  Patient was given IV fluids for hydration and Toradol for analgesia.  Calamine ointment was applied to areas of burns.  Basic labs were checked and results were normal.  On reassessment, patient is resting comfortably.  He was given reassurance and advised to continue supportive care at home.  Patient is, at this time, requesting more narcotic pain medications.  Given his history of drug abuse, this request was declined.  Patient was advised to continue ibuprofen and Tylenol as needed.  He was discharged in stable condition.        Final Clinical Impression(s) / ED Diagnoses Final diagnoses:  Sunburn    Rx / DC Orders ED Discharge Orders          Ordered    calamine lotion  As needed        05/06/22 1003              Gloris Manchester, MD 05/06/22 1004

## 2022-05-06 NOTE — Discharge Instructions (Signed)
Continue supportive care at home.  This includes staying hydrated and using over-the-counter pain medications (ibuprofen and Tylenol) as needed.  There was a prescription for calamine lotion that was sent to your pharmacy.  You can apply this to areas of burns for comfort.  Avoid further sun exposure until sunburn has resolved.

## 2022-05-07 MED FILL — Oxycodone w/ Acetaminophen Tab 5-325 MG: ORAL | Qty: 6 | Status: AC

## 2022-09-10 ENCOUNTER — Ambulatory Visit: Payer: No Typology Code available for payment source | Admitting: Internal Medicine

## 2022-10-01 ENCOUNTER — Ambulatory Visit: Payer: No Typology Code available for payment source | Admitting: Internal Medicine

## 2022-10-24 ENCOUNTER — Ambulatory Visit: Payer: No Typology Code available for payment source | Admitting: Internal Medicine

## 2022-10-26 ENCOUNTER — Ambulatory Visit: Payer: No Typology Code available for payment source | Admitting: Internal Medicine

## 2022-11-12 ENCOUNTER — Ambulatory Visit: Payer: No Typology Code available for payment source | Admitting: Internal Medicine

## 2022-12-17 ENCOUNTER — Ambulatory Visit: Payer: No Typology Code available for payment source | Admitting: Internal Medicine

## 2023-01-14 ENCOUNTER — Ambulatory Visit: Payer: No Typology Code available for payment source | Admitting: Internal Medicine

## 2023-02-06 ENCOUNTER — Ambulatory Visit: Payer: No Typology Code available for payment source | Admitting: Internal Medicine

## 2023-09-13 ENCOUNTER — Ambulatory Visit: Payer: Self-pay | Admitting: Family Medicine

## 2023-09-18 ENCOUNTER — Telehealth: Payer: Self-pay

## 2023-09-18 NOTE — Telephone Encounter (Signed)
I tried to call the patient by phone regarding the new patient appointment that was made on 09/30/2023 with Dr. Faylene Kurtz for "sti test req d/t hep c exposure. chronic constipation. refills. pt has tennis ball-sized lipoma behind armpit. occasional debilitating migraines. sleep apnea. referral requested for ENT for tinnitis & clicking in jaw."  I left a message requesting the patient to call the office back ASAP and asked to speak with me Idalia Needle) at the office.  Waiting for the patient to call the office back.  Concerned that he he is waiting STI testing and waiting until 09/30/2023.

## 2023-09-23 NOTE — Telephone Encounter (Signed)
Looks like the appointment with our office was canceled on 09/18/2023 by Gilman Schmidt: cancellation reason: Error.

## 2023-09-30 ENCOUNTER — Ambulatory Visit: Payer: No Typology Code available for payment source

## 2023-10-01 ENCOUNTER — Ambulatory Visit: Payer: Self-pay | Admitting: Family Medicine

## 2023-10-01 ENCOUNTER — Telehealth: Payer: Self-pay | Admitting: General Practice

## 2023-10-01 NOTE — Telephone Encounter (Signed)
Copied from CRM [id number]. Topic: Appointments - Appointment Scheduling >> Oct 01, 2023 10:17 AM Seth Wheeler wrote: Reason for CRM: Patient called in to be scheduled for an appointment  stated he was discharged advise due to multiple cancellations he was dismissed /Insisted on speaking with someone else and would like to be called back

## 2023-10-03 NOTE — Telephone Encounter (Signed)
Reviewed patient chart to determine reason for dismissal.   Patient no show/canceled eight New Patient appointments with provider.   No Show policy allows for 3 missed appointments unless otherwise approved by the provider.  Provider and Practice Admin support discharge from practice.

## 2023-10-14 ENCOUNTER — Telehealth: Payer: Self-pay | Admitting: General Practice

## 2023-10-14 NOTE — Telephone Encounter (Signed)
Copied from CRM [id number]. Topic: General - Other >> Oct 14, 2023  2:22 PM Donita Brooks wrote: Reason for CRM: pt was told by supervisor that the will give him a call regarding getting kick out if program.

## 2023-10-14 NOTE — Telephone Encounter (Signed)
Patient unable to establish with [facility] due to high number of cancellation/no shows of New Patient appointments. Letter sent to patient.

## 2023-12-03 ENCOUNTER — Ambulatory Visit: Payer: BC Managed Care – PPO | Admitting: Family Medicine

## 2024-02-17 ENCOUNTER — Encounter (HOSPITAL_COMMUNITY): Payer: Self-pay | Admitting: Emergency Medicine

## 2024-02-17 ENCOUNTER — Emergency Department (HOSPITAL_COMMUNITY)
Admission: EM | Admit: 2024-02-17 | Discharge: 2024-02-17 | Disposition: A | Discharge status: Home / Self Care | Attending: Emergency Medicine | Admitting: Emergency Medicine

## 2024-02-17 ENCOUNTER — Other Ambulatory Visit: Payer: Self-pay

## 2024-02-17 DIAGNOSIS — R21 Rash and other nonspecific skin eruption: Secondary | ICD-10-CM | POA: Diagnosis present

## 2024-02-17 DIAGNOSIS — L739 Follicular disorder, unspecified: Secondary | ICD-10-CM | POA: Insufficient documentation

## 2024-02-17 HISTORY — DX: Essential (primary) hypertension: I10

## 2024-02-17 HISTORY — DX: Depression, unspecified: F32.A

## 2024-02-17 LAB — CBC WITH DIFFERENTIAL/PLATELET
Abs Immature Granulocytes: 0.01 10*3/uL (ref 0.00–0.07)
Basophils Absolute: 0 10*3/uL (ref 0.0–0.1)
Basophils Relative: 1 %
Eosinophils Absolute: 0.1 10*3/uL (ref 0.0–0.5)
Eosinophils Relative: 3 %
HCT: 44.6 % (ref 39.0–52.0)
Hemoglobin: 14.3 g/dL (ref 13.0–17.0)
Immature Granulocytes: 0 %
Lymphocytes Relative: 27 %
Lymphs Abs: 1.3 10*3/uL (ref 0.7–4.0)
MCH: 30.4 pg (ref 26.0–34.0)
MCHC: 32.1 g/dL (ref 30.0–36.0)
MCV: 94.7 fL (ref 80.0–100.0)
Monocytes Absolute: 0.3 10*3/uL (ref 0.1–1.0)
Monocytes Relative: 6 %
Neutro Abs: 3.2 10*3/uL (ref 1.7–7.7)
Neutrophils Relative %: 63 %
Platelets: 236 10*3/uL (ref 150–400)
RBC: 4.71 MIL/uL (ref 4.22–5.81)
RDW: 12.5 % (ref 11.5–15.5)
WBC: 4.9 10*3/uL (ref 4.0–10.5)
nRBC: 0 % (ref 0.0–0.2)

## 2024-02-17 LAB — HEPATITIS PANEL, ACUTE
HCV Ab: NONREACTIVE
Hep A IgM: NONREACTIVE
Hep B C IgM: NONREACTIVE
Hepatitis B Surface Ag: NONREACTIVE

## 2024-02-17 MED ORDER — SULFAMETHOXAZOLE-TRIMETHOPRIM 800-160 MG PO TABS: 1.0000 | ORAL_TABLET | Freq: Two times a day (BID) | ORAL | 0 refills | Status: AC

## 2024-02-17 MED ORDER — SULFAMETHOXAZOLE-TRIMETHOPRIM 800-160 MG PO TABS
1.0000 | ORAL_TABLET | Freq: Once | ORAL | Status: AC
  Administered 2024-02-17: 1 via ORAL
  Filled 2024-02-17: qty 1

## 2024-02-17 NOTE — ED Triage Notes (Signed)
 Pt has rash to both leg, greater in the right, no pain or fever, for several months.

## 2024-02-17 NOTE — ED Provider Notes (Signed)
 [place] EMERGENCY DEPARTMENT AT [facility] Provider Note   CSN: [id number] Arrival date & time: 02/17/24  1119     History  Chief Complaint  Patient presents with   Rash    Seth Wheeler is a 43 y.o. male.  HPI Patient presents with concern of rash.  He notes he has had rash on both legs, right greater than left for few weeks, worsening over the past few days.  Rash is slightly irritating, spread.  No systemic complaints including fever, nausea, vomiting, skin color changes. After initial discussion the patient also voices some concern of possible hepatitis exposure.  He states that his wife has been diagnosed with hepatitis C, and no he has no other systemic symptoms, he is concerned about this himself and requests testing.     Home Medications Prior to Admission medications   Medication Sig Start Date End Date Taking? Authorizing Provider  sulfamethoxazole -trimethoprim  (BACTRIM  DS) 800-160 MG tablet Take 1 tablet by mouth 2 (two) times daily for 5 days. 02/17/24 02/22/24 Yes Dorenda Gandy, MD  calamine lotion Apply 1 Application topically as needed for itching. 05/06/22   Iva Mariner, MD  oxyCODONE -acetaminophen  (PERCOCET/ROXICET) 5-325 MG tablet Take 1 tablet by mouth every 6 (six) hours as needed for severe pain. 05/05/22   Ballard Bongo, MD      Allergies    Patient has no known allergies.    Review of Systems   Review of Systems  Physical Exam Updated Vital Signs BP 132/87   Pulse 88   Temp 98.9 F (37.2 C) (Oral)   Resp 20   SpO2 99%  Physical Exam Vitals and nursing note reviewed.  Constitutional:      General: He is not in acute distress.    Appearance: He is well-developed.  HENT:     Head: Normocephalic and atraumatic.  Eyes:     Conjunctiva/sclera: Conjunctivae normal.  Cardiovascular:     Rate and Rhythm: Normal rate and regular rhythm.     Pulses: Normal pulses.  Pulmonary:     Effort: Pulmonary effort is normal. No  respiratory distress.     Breath sounds: No stridor.  Abdominal:     General: There is no distension.  Skin:    General: Skin is warm and dry.     Comments: Folliculitis right leg greater than left.  Neurological:     Mental Status: He is alert and oriented to person, place, and time.     ED Results / Procedures / Treatments   Labs (all labs ordered are listed, but only abnormal results are displayed) Labs Reviewed  CBC WITH DIFFERENTIAL/PLATELET  HEPATITIS PANEL, ACUTE    EKG None  Radiology No results found.  Procedures Procedures    Medications Ordered in ED Medications  sulfamethoxazole -trimethoprim  (BACTRIM  DS) 800-160 MG per tablet 1 tablet (1 tablet Oral Given 02/17/24 1254)    ED Course/ Medical Decision Making/ A&P                                 Medical Decision Making Adult male presents with bilateral skin infections on his lower extremities, no evidence for bacteremia, sepsis with no fever, distress, or other complaints.  Patient started on Bactrim  here.  In addition, patient requests hepatitis testing and this was facilitated, that the patient can follow-up with these results as an outpatient.  Amount and/or Complexity of Data Reviewed Labs: ordered. Decision-making details  documented in ED Course.    Details: Unremarkable chemistry panel  Risk Prescription drug management.   On repeat exam patient in no distress, awake, alert, aware of findings.        Final Clinical Impression(s) / ED Diagnoses Final diagnoses:  Folliculitis    Rx / DC Orders ED Discharge Orders          Ordered    sulfamethoxazole -trimethoprim  (BACTRIM  DS) 800-160 MG tablet  2 times daily        02/17/24 1324              Dorenda Gandy, MD 02/17/24 1324

## 2024-02-17 NOTE — Discharge Instructions (Signed)
 As discussed, your evaluation today has been largely reassuring.  But, it is important that you monitor your condition carefully, and do not hesitate to return to the ED if you develop new, or concerning changes in your condition.  Otherwise, please follow-up with your physician for appropriate ongoing care.  In addition, your additional lab tests are pending, follow-up with your physician for these results.
